# Patient Record
Sex: Female | Born: 1960 | Race: White | Hispanic: No | Smoking: Current every day smoker
Health system: Southern US, Community
[De-identification: ages and names within clinical notes are randomized; demographics above are authoritative.]

## PROBLEM LIST (undated history)

## (undated) DIAGNOSIS — IMO0002 Reserved for concepts with insufficient information to code with codable children: Secondary | ICD-10-CM

## (undated) DIAGNOSIS — B977 Papillomavirus as the cause of diseases classified elsewhere: Secondary | ICD-10-CM

## (undated) DIAGNOSIS — F32A Depression, unspecified: Secondary | ICD-10-CM

## (undated) DIAGNOSIS — Q400 Congenital hypertrophic pyloric stenosis: Secondary | ICD-10-CM

## (undated) HISTORY — DX: Reserved for concepts with insufficient information to code with codable children: IMO0002

## (undated) HISTORY — PX: TONSILLECTOMY AND ADENOIDECTOMY: SUR1326

## (undated) HISTORY — PX: TUBAL LIGATION: SHX77

## (undated) HISTORY — DX: Papillomavirus as the cause of diseases classified elsewhere: B97.7

---

## 2010-02-28 DIAGNOSIS — IMO0002 Reserved for concepts with insufficient information to code with codable children: Secondary | ICD-10-CM

## 2010-02-28 HISTORY — PX: COLPOSCOPY: SHX161

## 2010-02-28 HISTORY — DX: Reserved for concepts with insufficient information to code with codable children: IMO0002

## 2010-03-15 ENCOUNTER — Encounter

## 2010-03-15 ENCOUNTER — Other Ambulatory Visit: Payer: Self-pay | Admitting: Gynecology

## 2010-03-15 ENCOUNTER — Ambulatory Visit (INDEPENDENT_AMBULATORY_CARE_PROVIDER_SITE_OTHER): Admitting: Gynecology

## 2010-03-15 DIAGNOSIS — N871 Moderate cervical dysplasia: Secondary | ICD-10-CM

## 2010-03-15 DIAGNOSIS — N870 Mild cervical dysplasia: Secondary | ICD-10-CM

## 2010-09-19 ENCOUNTER — Ambulatory Visit (INDEPENDENT_AMBULATORY_CARE_PROVIDER_SITE_OTHER): Admitting: Gynecology

## 2010-09-19 ENCOUNTER — Other Ambulatory Visit (HOSPITAL_COMMUNITY)
Admission: RE | Admit: 2010-09-19 | Discharge: 2010-09-19 | Disposition: A | Discharge status: Home / Self Care | Source: Ambulatory Visit | Attending: Gynecology | Admitting: Gynecology

## 2010-09-19 ENCOUNTER — Encounter: Payer: Self-pay | Admitting: Gynecology

## 2010-09-19 DIAGNOSIS — N870 Mild cervical dysplasia: Secondary | ICD-10-CM

## 2010-09-19 DIAGNOSIS — N879 Dysplasia of cervix uteri, unspecified: Secondary | ICD-10-CM

## 2010-09-19 DIAGNOSIS — Z01419 Encounter for gynecological examination (general) (routine) without abnormal findings: Secondary | ICD-10-CM | POA: Insufficient documentation

## 2010-09-19 NOTE — Progress Notes (Signed)
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 She does give a history of abnormal Pap when she was in her early 10s and although she said she had no treatment at last visit she now states that she had a cone biopsy and that everything was fine afterwards.  Exam Pelvic: External BUS vagina normal cervix grossly normal. Pap smear done followup ECC performed.  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 Pap and ECC done today. I reviewed with her the various issues particularly with a positive ECC and whether we should proceed with a LEEP or Cone biopsy. The issues of positive high risk HPV were reviewed and she understands that even if we would proceed with Cone biopsy or hysterectomy this would not eliminate the virus and the potential for persistent cervical disease andvaginal intraepithelial neoplasia and vulvar intraepithelial neoplasia is present.  Patient knows importance of followup and that we will call her with the biopsy results in further discuss the plan.

## 2010-09-22 ENCOUNTER — Telehealth: Payer: Self-pay | Admitting: Gynecology

## 2010-09-22 NOTE — Telephone Encounter (Signed)
Tell pt that Pap and ECC were normal.  Recommend re-pap in 6 months.  Very important to follow up.

## 2010-09-24 NOTE — Telephone Encounter (Signed)
PT INFORMED WITH THE BELOW NOTE AND RECALL LETTER IN COMPUTER.

## 2011-06-20 ENCOUNTER — Ambulatory Visit (INDEPENDENT_AMBULATORY_CARE_PROVIDER_SITE_OTHER): Admitting: Family Medicine

## 2011-06-20 VITALS — BP 148/74 | HR 71 | Temp 98.3°F | Resp 16 | Ht 66.25 in | Wt 129.8 lb

## 2011-06-20 DIAGNOSIS — M545 Low back pain: Secondary | ICD-10-CM

## 2011-06-20 LAB — POCT UA - MICROSCOPIC ONLY
Casts, Ur, LPF, POC: NEGATIVE
Yeast, UA: NEGATIVE

## 2011-06-20 LAB — POCT URINALYSIS DIPSTICK
Bilirubin, UA: NEGATIVE
Blood, UA: NEGATIVE
Leukocytes, UA: NEGATIVE
Nitrite, UA: NEGATIVE
Urobilinogen, UA: 1
pH, UA: 7

## 2011-06-20 NOTE — Progress Notes (Signed)
  Subjective:    Patient ID: Crystal Richards, female    DOB: Mar 07, 1960, 51 y.o.   MRN: [id number]  HPI 51 yo female here with back pain.  Left sided low back pain for 2-3 days.  STarting to radiate to right.  Stomach crampy.  Hurts all the time.  NO radiation down legs.  No fever or nausea.  No dysuria or frequency or urgency.  Some waxing and waning.     Review of Systems Negative except as per HPI     Objective:   Physical Exam    Results for orders placed in visit on 06/20/11  POCT UA - MICROSCOPIC ONLY      Component Value Range   WBC, Ur, HPF, POC 1-3     RBC, urine, microscopic 0-2     Bacteria, U Microscopic trace     Mucus, UA neg     Epithelial cells, urine per micros 2-4     Crystals, Ur, HPF, POC neg     Casts, Ur, LPF, POC neg     Yeast, UA neg    POCT URINALYSIS DIPSTICK      Component Value Range   Color, UA amber     Clarity, UA hazy     Glucose, UA neg     Bilirubin, UA neg     Ketones, UA 15     Spec Grav, UA 1.025     Blood, UA neg     pH, UA 7.0     Protein, UA trace     Urobilinogen, UA 1.0     Nitrite, UA neg     Leukocytes, UA Negative         Assessment & Plan:  Low back pain - will culture urine, though no urinary symptoms.  Suspect MSK, eventhough no remembered agggravating event.  Declines flexeril while waiting.  Will continue ibuprofen, heat, ice.  If worsens or develops urine symptoms, call and we can call out an antibiotic.

## 2014-12-14 ENCOUNTER — Emergency Department (HOSPITAL_COMMUNITY)

## 2014-12-14 ENCOUNTER — Emergency Department (HOSPITAL_COMMUNITY)
Admission: EM | Admit: 2014-12-14 | Discharge: 2014-12-14 | Disposition: A | Discharge status: Home / Self Care | Attending: Emergency Medicine | Admitting: Emergency Medicine

## 2014-12-14 ENCOUNTER — Encounter (HOSPITAL_COMMUNITY): Payer: Self-pay | Admitting: Emergency Medicine

## 2014-12-14 DIAGNOSIS — R350 Frequency of micturition: Secondary | ICD-10-CM | POA: Diagnosis present

## 2014-12-14 DIAGNOSIS — Z880 Allergy status to penicillin: Secondary | ICD-10-CM | POA: Insufficient documentation

## 2014-12-14 DIAGNOSIS — R630 Anorexia: Secondary | ICD-10-CM | POA: Insufficient documentation

## 2014-12-14 DIAGNOSIS — K5900 Constipation, unspecified: Secondary | ICD-10-CM | POA: Diagnosis not present

## 2014-12-14 DIAGNOSIS — F17200 Nicotine dependence, unspecified, uncomplicated: Secondary | ICD-10-CM | POA: Insufficient documentation

## 2014-12-14 DIAGNOSIS — N390 Urinary tract infection, site not specified: Secondary | ICD-10-CM | POA: Insufficient documentation

## 2014-12-14 DIAGNOSIS — Z8619 Personal history of other infectious and parasitic diseases: Secondary | ICD-10-CM | POA: Diagnosis not present

## 2014-12-14 LAB — URINALYSIS, ROUTINE W REFLEX MICROSCOPIC
Bilirubin Urine: NEGATIVE
GLUCOSE, UA: NEGATIVE mg/dL
Nitrite: POSITIVE — AB
PH: 5.5 (ref 5.0–8.0)
SPECIFIC GRAVITY, URINE: 1.025 (ref 1.005–1.030)

## 2014-12-14 LAB — URINE MICROSCOPIC-ADD ON

## 2014-12-14 LAB — CBC WITH DIFFERENTIAL/PLATELET
Basophils Absolute: 0 10*3/uL (ref 0.0–0.1)
Basophils Relative: 0 %
EOS PCT: 0 %
Eosinophils Absolute: 0 10*3/uL (ref 0.0–0.7)
HCT: 43.6 % (ref 36.0–46.0)
Hemoglobin: 15.1 g/dL — ABNORMAL HIGH (ref 12.0–15.0)
LYMPHS ABS: 0.9 10*3/uL (ref 0.7–4.0)
LYMPHS PCT: 8 %
MCH: 34.4 pg — AB (ref 26.0–34.0)
MCHC: 34.6 g/dL (ref 30.0–36.0)
MCV: 99.3 fL (ref 78.0–100.0)
MONO ABS: 0.5 10*3/uL (ref 0.1–1.0)
Monocytes Relative: 4 %
Neutro Abs: 10.5 10*3/uL — ABNORMAL HIGH (ref 1.7–7.7)
Neutrophils Relative %: 88 %
PLATELETS: 279 10*3/uL (ref 150–400)
RBC: 4.39 MIL/uL (ref 3.87–5.11)
RDW: 12.3 % (ref 11.5–15.5)
WBC: 11.9 10*3/uL — ABNORMAL HIGH (ref 4.0–10.5)

## 2014-12-14 LAB — COMPREHENSIVE METABOLIC PANEL
ALT: 21 U/L (ref 14–54)
AST: 24 U/L (ref 15–41)
Albumin: 4.5 g/dL (ref 3.5–5.0)
Alkaline Phosphatase: 56 U/L (ref 38–126)
Anion gap: 9 (ref 5–15)
BUN: 20 mg/dL (ref 6–20)
CALCIUM: 9.4 mg/dL (ref 8.9–10.3)
CHLORIDE: 105 mmol/L (ref 101–111)
CO2: 24 mmol/L (ref 22–32)
CREATININE: 0.47 mg/dL (ref 0.44–1.00)
Glucose, Bld: 138 mg/dL — ABNORMAL HIGH (ref 65–99)
Potassium: 4 mmol/L (ref 3.5–5.1)
Sodium: 138 mmol/L (ref 135–145)
TOTAL PROTEIN: 7.6 g/dL (ref 6.5–8.1)
Total Bilirubin: 0.7 mg/dL (ref 0.3–1.2)

## 2014-12-14 LAB — LIPASE, BLOOD: LIPASE: 34 U/L (ref 11–51)

## 2014-12-14 MED ORDER — PHENAZOPYRIDINE HCL 100 MG PO TABS
200.0000 mg | ORAL_TABLET | Freq: Once | ORAL | Status: AC
  Administered 2014-12-14: 200 mg via ORAL
  Filled 2014-12-14: qty 2

## 2014-12-14 MED ORDER — HYDROCODONE-ACETAMINOPHEN 5-325 MG PO TABS: 1.0000 | ORAL_TABLET | ORAL | Status: DC | PRN

## 2014-12-14 MED ORDER — MORPHINE SULFATE (PF) 4 MG/ML IV SOLN
4.0000 mg | Freq: Once | INTRAVENOUS | Status: AC
Start: 2014-12-14 — End: 2014-12-14
  Administered 2014-12-14: 4 mg via INTRAVENOUS
  Filled 2014-12-14: qty 1

## 2014-12-14 MED ORDER — SULFAMETHOXAZOLE-TRIMETHOPRIM 800-160 MG PO TABS: 1.0000 | ORAL_TABLET | Freq: Two times a day (BID) | ORAL | Status: AC

## 2014-12-14 MED ORDER — PHENAZOPYRIDINE HCL 200 MG PO TABS: 200.0000 mg | ORAL_TABLET | Freq: Three times a day (TID) | ORAL | Status: DC

## 2014-12-14 MED ORDER — SULFAMETHOXAZOLE-TRIMETHOPRIM 800-160 MG PO TABS
1.0000 | ORAL_TABLET | Freq: Once | ORAL | Status: AC
  Administered 2014-12-14: 1 via ORAL
  Filled 2014-12-14: qty 1

## 2014-12-14 MED ORDER — ONDANSETRON HCL 4 MG/2ML IJ SOLN
4.0000 mg | Freq: Once | INTRAMUSCULAR | Status: AC
  Administered 2014-12-14: 4 mg via INTRAVENOUS
  Filled 2014-12-14: qty 2

## 2014-12-14 NOTE — ED Notes (Signed)
After triage, pt says she is having burning when voiding.

## 2014-12-14 NOTE — ED Provider Notes (Signed)
CSN: [id number]     Arrival date & time 12/14/14  1348 History   First MD Initiated Contact with Patient 12/14/14 1357     Chief Complaint  Patient presents with  . Abdominal Pain  . Urinary Tract Infection     (Consider location/radiation/quality/duration/timing/severity/associated sxs/prior Treatment) The history is provided by the patient.   Crystal Richards is a 54 y.o. female presenting with a one week history of urinary frequency along with burning pain with urination suggesting a uti which she reports having occasionally.  She denies fevers, chills, nausea or vomiting, no hematuria, back or flank pain. However, last night she woke with aching and stabbing mid abdominal pain, at first she thought she ate something "bad" but denies nausea, vomiting, diarrhea.  States last bm was 2 days ago and normal, but unusual since she usually has bm's daily.  She had anorexia today stating she was afraid to eat and make it worse.  Pain is worsened with movement but can intensify at rest as well. She has had no medicine or found any alleviators prior to arrival.     Past Medical History  Diagnosis Date  . 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 High risk HPV infection    Past Surgical History  Procedure Laterality Date  . Tonsillectomy and adenoidectomy    . Tubal ligation    . Cesarean section    . Colposcopy  02/2010    + ECC LGSIL fragment   Family History  Problem Relation Age of Onset  . Diabetes Mother   . Hypertension Mother    Social History  Substance Use Topics  . Smoking status: Current Every Day Smoker -- 0.80 packs/day  . Smokeless tobacco: Never Used  . Alcohol Use: Yes     Comment: occassionally   OB History    Gravida Para Term Preterm AB TAB SAB Ectopic Multiple Living   3 3             Review of Systems  Constitutional: Negative for fever.  HENT: Negative for congestion and sore throat.   Eyes: Negative.   Respiratory: Negative for  chest tightness and shortness of breath.   Cardiovascular: Negative for chest pain.  Gastrointestinal: Positive for abdominal pain and constipation. Negative for nausea, vomiting and diarrhea.  Genitourinary: Positive for dysuria and frequency. Negative for urgency, hematuria and pelvic pain.  Musculoskeletal: Negative for joint swelling, arthralgias and neck pain.  Skin: Negative.  Negative for rash and wound.  Neurological: Negative for dizziness, weakness, light-headedness, numbness and headaches.  Psychiatric/Behavioral: Negative.       Allergies  Amoxicillin  Home Medications   Prior to Admission medications   Medication Sig Start Date End Date Taking? Authorizing Provider  HYDROcodone-acetaminophen (NORCO/VICODIN) 5-325 MG tablet Take 1 tablet by mouth every 4 (four) hours as needed. 12/14/14   Burgess Amor, PA-C  phenazopyridine (PYRIDIUM) 200 MG tablet Take 1 tablet (200 mg total) by mouth 3 (three) times daily. 12/14/14   Burgess Amor, PA-C  sulfamethoxazole-trimethoprim (BACTRIM DS,SEPTRA DS) 800-160 MG tablet Take 1 tablet by mouth 2 (two) times daily. 12/14/14 12/21/14  Burgess Amor, PA-C   BP 124/78 mmHg  Pulse 63  Temp(Src) 98.4 F (36.9 C) (Oral)  Resp 18  Ht  (1.676 m)  Wt 118 lb (53.524 kg)  BMI 19.05 kg/m2  SpO2 96%  LMP 08/28/2010 Physical Exam  Constitutional: She appears well-developed and well-nourished.  HENT:  Head: Normocephalic and atraumatic.  Eyes: Conjunctivae are  normal.  Neck: Normal range of motion.  Cardiovascular: Normal rate, regular rhythm, normal heart sounds and intact distal pulses.   Pulmonary/Chest: Effort normal and breath sounds normal. No respiratory distress.  Abdominal: Soft. Bowel sounds are normal. She exhibits no mass. There is no hepatosplenomegaly. There is tenderness in the right upper quadrant and periumbilical area. There is positive Murphy's sign. There is no rebound, no guarding and no CVA tenderness.  Musculoskeletal:  Normal range of motion.  Neurological: She is alert.  Skin: Skin is warm and dry.  Psychiatric: She has a normal mood and affect.  Nursing note and vitals reviewed.   ED Course  Procedures (including critical care time) Labs Review Labs Reviewed  URINALYSIS, ROUTINE W REFLEX MICROSCOPIC (NOT AT [facility]) - Abnormal; Notable for the following:    Hgb urine dipstick TRACE (*)    Ketones, ur TRACE (*)    Protein, ur TRACE (*)    Nitrite POSITIVE (*)    Leukocytes, UA SMALL (*)    All other components within normal limits  CBC WITH DIFFERENTIAL/PLATELET - Abnormal; Notable for the following:    WBC 11.9 (*)    Hemoglobin 15.1 (*)    MCH 34.4 (*)    Neutro Abs 10.5 (*)    All other components within normal limits  COMPREHENSIVE METABOLIC PANEL - Abnormal; Notable for the following:    Glucose, Bld 138 (*)    All other components within normal limits  URINE MICROSCOPIC-ADD ON - Abnormal; Notable for the following:    Squamous Epithelial / LPF 0-5 (*)    Bacteria, UA MANY (*)    All other components within normal limits  URINE CULTURE  LIPASE, BLOOD    Imaging Review [place] Abdomen Limited  12/14/2014  CLINICAL DATA:  Right upper quadrant pain EXAM: US ABDOMEN LIMITED - RIGHT UPPER QUADRANT COMPARISON:  None. FINDINGS: Gallbladder: No gallstones or wall thickening visualized. No sonographic Murphy sign noted. Common bile duct: Diameter: 2.1 mm Liver: No focal lesion identified. Within normal limits in parenchymal echogenicity. IMPRESSION: Normal exam. Electronically Signed   By: Signa Kellaylor  Stroud M.D.   On: 12/14/2014 16:59   I have personally reviewed and evaluated these images and lab results as part of my medical decision-making.   EKG Interpretation None      MDM   Final diagnoses:  UTI (lower urinary tract infection)    Patients labs reviewed.    Results were also discussed with patient. Pt with uti, but pain in upper abdomen/ruq, not c/w uti.  No cva ttp, doubt  pyelonephritis.  Urine culture was ordered.  She was started on bactrim, first dose given here.   US results reviewed and negative.  Pyridium also prescribed, encouraged increased fluid intake, recheck if sx persist or worsen.  Referral given for pcp, advised return here for any worsened or changing sx, fevers, worse pain, vomiting, back or flank pain, weakness.     Burgess AmorJulie Denym Christenberry, PA-C 12/15/14 [id number]  Azalia BilisKevin Campos, MD 12/15/14 [phone]

## 2014-12-14 NOTE — ED Notes (Signed)
PA Julie Idol at bedside. 

## 2014-12-14 NOTE — ED Notes (Signed)
PA Julie Idol at bedside updating patient. 

## 2014-12-14 NOTE — ED Notes (Signed)
Pt made aware a urine specimen is needed. Pt verbalized understanding and will notify staff when one can be obtained.  

## 2014-12-14 NOTE — ED Notes (Signed)
Started with Mid abdominal pain, rates pain 8/10.  Pain is aching and stabbing.

## 2014-12-14 NOTE — Discharge Instructions (Signed)

## 2014-12-15 LAB — URINE CULTURE

## 2014-12-19 ENCOUNTER — Emergency Department (HOSPITAL_COMMUNITY)
Admission: EM | Admit: 2014-12-19 | Discharge: 2014-12-20 | Disposition: A | Discharge status: Home / Self Care | Attending: Emergency Medicine | Admitting: Emergency Medicine

## 2014-12-19 ENCOUNTER — Encounter (HOSPITAL_COMMUNITY): Payer: Self-pay

## 2014-12-19 DIAGNOSIS — Z880 Allergy status to penicillin: Secondary | ICD-10-CM | POA: Diagnosis not present

## 2014-12-19 DIAGNOSIS — F17200 Nicotine dependence, unspecified, uncomplicated: Secondary | ICD-10-CM | POA: Insufficient documentation

## 2014-12-19 DIAGNOSIS — Z79899 Other long term (current) drug therapy: Secondary | ICD-10-CM | POA: Diagnosis not present

## 2014-12-19 DIAGNOSIS — R1032 Left lower quadrant pain: Secondary | ICD-10-CM | POA: Diagnosis not present

## 2014-12-19 DIAGNOSIS — Z8619 Personal history of other infectious and parasitic diseases: Secondary | ICD-10-CM | POA: Diagnosis not present

## 2014-12-19 DIAGNOSIS — R109 Unspecified abdominal pain: Secondary | ICD-10-CM | POA: Diagnosis present

## 2014-12-19 DIAGNOSIS — R300 Dysuria: Secondary | ICD-10-CM | POA: Diagnosis not present

## 2014-12-19 DIAGNOSIS — R509 Fever, unspecified: Secondary | ICD-10-CM | POA: Insufficient documentation

## 2014-12-19 DIAGNOSIS — R110 Nausea: Secondary | ICD-10-CM | POA: Diagnosis not present

## 2014-12-19 LAB — URINALYSIS, ROUTINE W REFLEX MICROSCOPIC
Glucose, UA: 100 mg/dL — AB
Nitrite: NEGATIVE
Protein, ur: 30 mg/dL — AB
Specific Gravity, Urine: 1.025 (ref 1.005–1.030)
pH: 6 (ref 5.0–8.0)

## 2014-12-19 LAB — URINE MICROSCOPIC-ADD ON

## 2014-12-19 MED ORDER — CIPROFLOXACIN HCL 500 MG PO TABS: 500.0000 mg | ORAL_TABLET | Freq: Two times a day (BID) | ORAL | Status: DC

## 2014-12-19 MED ORDER — KETOROLAC TROMETHAMINE 30 MG/ML IJ SOLN
15.0000 mg | Freq: Once | INTRAMUSCULAR | Status: AC
  Administered 2014-12-19: 15 mg via INTRAVENOUS
  Filled 2014-12-19: qty 1

## 2014-12-19 MED ORDER — SODIUM CHLORIDE 0.9 % IV BOLUS (SEPSIS)
1000.0000 mL | Freq: Once | INTRAVENOUS | Status: AC
  Administered 2014-12-19: 1000 mL via INTRAVENOUS

## 2014-12-19 MED ORDER — CIPROFLOXACIN IN D5W 400 MG/200ML IV SOLN
400.0000 mg | Freq: Once | INTRAVENOUS | Status: AC
  Administered 2014-12-19: 400 mg via INTRAVENOUS
  Filled 2014-12-19: qty 200

## 2014-12-19 NOTE — ED Provider Notes (Signed)
CSN: [id number]     Arrival date & time 12/19/14  2118 History  By signing my name below, I, Crystal Richards, attest that this documentation has been prepared under the direction and in the presence of Crystal RazorStephen Bradleigh Sonnen, MD. Electronically Signed: Budd PalmerVanessa Richards, ED Scribe. 12/19/2014. 10:19 PM.    Chief Complaint  Patient presents with  . Flank Pain   The history is provided by the patient. No language interpreter was used.   HPI Comments: Crystal ScoreMarie Lobo is a 54 y.o. female who presents to the Emergency Department complaining of constant, aching, left flank pain onset earlier today. She notes she was seen in the ED 3 days ago and diagnosed with a UTI. She states she has been taking the prescribed bactrim with no improvement. She reports associated abdominal pain, dysuria, subjective fever, and nausea. Pt denies SOB and vomiting.  Pt is allergic to amoxicillin.   Past Medical History  Diagnosis Date  . 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 High risk HPV infection    Past Surgical History  Procedure Laterality Date  . Tonsillectomy and adenoidectomy    . Tubal ligation    . Cesarean section    . Colposcopy  02/2010    + ECC LGSIL fragment   Family History  Problem Relation Age of Onset  . Diabetes Mother   . Hypertension Mother    Social History  Substance Use Topics  . Smoking status: Current Every Day Smoker -- 0.80 packs/day  . Smokeless tobacco: Never Used  . Alcohol Use: Yes     Comment: occassionally   OB History    Gravida Para Term Preterm AB TAB SAB Ectopic Multiple Living   3 3             Review of Systems  Constitutional: Positive for fever.  Respiratory: Negative for shortness of breath.   Gastrointestinal: Positive for nausea and abdominal pain. Negative for vomiting.  Genitourinary: Positive for dysuria and flank pain.  All other systems reviewed and are negative.   Allergies  Amoxicillin  Home Medications   Prior to Admission  medications   Medication Sig Start Date End Date Taking? Authorizing Provider  HYDROcodone-acetaminophen (NORCO/VICODIN) 5-325 MG tablet Take 1 tablet by mouth every 4 (four) hours as needed. 12/14/14   Burgess AmorJulie Idol, PA-C  phenazopyridine (PYRIDIUM) 200 MG tablet Take 1 tablet (200 mg total) by mouth 3 (three) times daily. 12/14/14   Burgess AmorJulie Idol, PA-C  sulfamethoxazole-trimethoprim (BACTRIM DS,SEPTRA DS) 800-160 MG tablet Take 1 tablet by mouth 2 (two) times daily. 12/14/14 12/21/14  Burgess AmorJulie Idol, PA-C   BP 108/76 mmHg  Pulse 121  Temp(Src) 99.4 F (37.4 C) (Oral)  Resp 16  Ht 5\' 6"  (1.676 m)  Wt 118 lb (53.524 kg)  BMI 19.05 kg/m2  SpO2 100%  LMP 08/28/2010 Physical Exam  Constitutional: She appears well-developed and well-nourished.  HENT:  Head: Normocephalic and atraumatic.  Eyes: Conjunctivae are normal. Right eye exhibits no discharge. Left eye exhibits no discharge.  Pulmonary/Chest: Effort normal. No respiratory distress.  Abdominal: There is tenderness.  Mild suprapubic, LLQ, and left lower flank TTP. No CVA TTP.  Neurological: She is alert. Coordination normal.  Skin: Skin is warm and dry. No rash noted. She is not diaphoretic. No erythema.  Psychiatric: She has a normal mood and affect.  Nursing note and vitals reviewed.   ED Course  Procedures  DIAGNOSTIC STUDIES: Oxygen Saturation is 100% on RA, normal by my interpretation.  COORDINATION OF CARE: 10:17 PM - Discussed possible pyelonephritis. Discussed culture results from previous visit. Discussed plans to order a different antibiotic as well as another round of diagnostic studies. Will write a note for work. Pt advised of plan for treatment and pt agrees.  Labs Review Labs Reviewed  URINALYSIS, ROUTINE W REFLEX MICROSCOPIC (NOT AT [facility]) - Abnormal; Notable for the following:    Color, Urine AMBER (*)    APPearance HAZY (*)    Glucose, UA 100 (*)    Hgb urine dipstick LARGE (*)    Bilirubin Urine MODERATE (*)     Ketones, ur TRACE (*)    Protein, ur 30 (*)    Leukocytes, UA TRACE (*)    All other components within normal limits  URINE MICROSCOPIC-ADD ON - Abnormal; Notable for the following:    Squamous Epithelial / LPF 6-30 (*)    Bacteria, UA MANY (*)    All other components within normal limits  URINE CULTURE    Imaging Review No results found. I have personally reviewed and evaluated these images and lab results as part of my medical decision-making.   EKG Interpretation None      MDM   Final diagnoses:  Flank pain   54yF with flank pain. Recent diagnosis of uti on bactrim. Taking w/o improvement. No culture data. Urine remains suggestive. Will switch for possible resistant organism. Culture sent. Doesn't seem like ureteral colic. It has been determined that no acute conditions requiring further emergency intervention are present at this time. The patient has been advised of the diagnosis and plan. I reviewed any labs and imaging including any potential incidental findings. We have discussed signs and symptoms that warrant return to the ED and they are listed in the discharge instructions.    I personally preformed the services scribed in my presence. The recorded information has been reviewed is accurate. Crystal Razor, MD.   Crystal Razor, MD 01/03/15 1340

## 2014-12-19 NOTE — ED Notes (Signed)
Patient was seen here last week and diagnosed with UTI. Patient states she has been taking her ABX with no relief, and no has left flank pain with nausea.

## 2014-12-21 ENCOUNTER — Encounter (HOSPITAL_COMMUNITY): Payer: Self-pay | Admitting: Emergency Medicine

## 2014-12-21 ENCOUNTER — Emergency Department (HOSPITAL_COMMUNITY)
Admission: EM | Admit: 2014-12-21 | Discharge: 2014-12-21 | Disposition: A | Discharge status: Home / Self Care | Attending: Emergency Medicine | Admitting: Emergency Medicine

## 2014-12-21 DIAGNOSIS — L270 Generalized skin eruption due to drugs and medicaments taken internally: Secondary | ICD-10-CM | POA: Diagnosis not present

## 2014-12-21 DIAGNOSIS — F17200 Nicotine dependence, unspecified, uncomplicated: Secondary | ICD-10-CM | POA: Diagnosis not present

## 2014-12-21 DIAGNOSIS — Z880 Allergy status to penicillin: Secondary | ICD-10-CM | POA: Insufficient documentation

## 2014-12-21 DIAGNOSIS — T368X5A Adverse effect of other systemic antibiotics, initial encounter: Secondary | ICD-10-CM | POA: Diagnosis not present

## 2014-12-21 DIAGNOSIS — T50905A Adverse effect of unspecified drugs, medicaments and biological substances, initial encounter: Secondary | ICD-10-CM

## 2014-12-21 DIAGNOSIS — Z8619 Personal history of other infectious and parasitic diseases: Secondary | ICD-10-CM | POA: Diagnosis not present

## 2014-12-21 DIAGNOSIS — Z87440 Personal history of urinary (tract) infections: Secondary | ICD-10-CM | POA: Diagnosis not present

## 2014-12-21 LAB — URINE CULTURE: Culture: NO GROWTH

## 2014-12-21 LAB — I-STAT CHEM 8, ED
BUN: 10 mg/dL (ref 6–20)
Calcium, Ion: 1.17 mmol/L (ref 1.12–1.23)
Chloride: 94 mmol/L — ABNORMAL LOW (ref 101–111)
Creatinine, Ser: 0.6 mg/dL (ref 0.44–1.00)
Glucose, Bld: 107 mg/dL — ABNORMAL HIGH (ref 65–99)
HCT: 36 % (ref 36.0–46.0)
Hemoglobin: 12.2 g/dL (ref 12.0–15.0)
Potassium: 3.9 mmol/L (ref 3.5–5.1)
Sodium: 133 mmol/L — ABNORMAL LOW (ref 135–145)
TCO2: 23 mmol/L (ref 0–100)

## 2014-12-21 LAB — URINALYSIS, ROUTINE W REFLEX MICROSCOPIC
Glucose, UA: NEGATIVE mg/dL
HGB URINE DIPSTICK: NEGATIVE
KETONES UR: 15 mg/dL — AB
Leukocytes, UA: NEGATIVE
NITRITE: NEGATIVE
PH: 5.5 (ref 5.0–8.0)
Protein, ur: NEGATIVE mg/dL
Specific Gravity, Urine: 1.025 (ref 1.005–1.030)

## 2014-12-21 MED ORDER — DIPHENHYDRAMINE HCL 25 MG PO CAPS
25.0000 mg | ORAL_CAPSULE | Freq: Once | ORAL | Status: AC
  Administered 2014-12-21: 25 mg via ORAL
  Filled 2014-12-21: qty 1

## 2014-12-21 MED ORDER — CEPHALEXIN 500 MG PO CAPS: 500.0000 mg | ORAL_CAPSULE | Freq: Four times a day (QID) | ORAL | Status: DC

## 2014-12-21 MED ORDER — CEPHALEXIN 500 MG PO CAPS
500.0000 mg | ORAL_CAPSULE | Freq: Once | ORAL | Status: AC
  Administered 2014-12-21: 500 mg via ORAL
  Filled 2014-12-21: qty 1

## 2014-12-21 NOTE — ED Notes (Signed)
Patient given Ginger Ale at this time. 

## 2014-12-21 NOTE — ED Notes (Signed)
Patient states she started Cipro for UTI on Monday. Complaining of generalized rash starting yesterday and swelling to joints this morning. Patient has hives noted to abdomen, torso, and bilateral arms

## 2014-12-21 NOTE — ED Notes (Signed)
Pt states understanding of care given and follow up instructions 

## 2014-12-21 NOTE — Discharge Instructions (Signed)
Drug Allergy °Allergic reactions to medicines are common. Some allergic reactions are mild. A delayed type of drug allergy that occurs 1 week or more after exposure to a medicine or vaccine is called serum sickness. A life-threatening, sudden (acute) allergic reaction that involves the whole body is called anaphylaxis. °CAUSES  °"True" drug allergies occur when there is an allergic reaction to a medicine. This is caused by overactivity of the immune system. First, the body becomes sensitized. The immune system is triggered by your first exposure to the medicine. Following this first exposure, future exposure to the same medicine may be life-threatening. °Almost any medicine can cause an allergic reaction. Common ones are: °· Penicillin. °· Sulfonamides (sulfa drugs). °· Local anesthetics. °· X-ray dyes that contain iodine. °SYMPTOMS  °Common symptoms of a minor allergic reaction are: °· Swelling around the mouth. °· An itchy red rash or hives. °· Vomiting or diarrhea. °Anaphylaxis can cause swelling of the mouth and throat. This makes it difficult to breathe and swallow. Severe reactions can be fatal within seconds, even after exposure to only a trace amount of the drug that causes the reaction. °HOME CARE INSTRUCTIONS °· If you are unsure of what caused your reaction, write down: °¨ The names of the medicines you took. °¨ How much medicine you took. °¨ How you took the medicine, such as whether you took a pill, injected the medicine, or applied it to your skin. °¨ All of the things you ate and drank. °¨ The date and time of your reaction. °¨ The symptoms of the reaction. °· You may want to follow up with an allergy specialist after the reaction has cleared in order to be tested to confirm the allergy. It is important to confirm that your reaction is an allergy, not just a side effect to the medicine. If you have a true allergy to a medicine, this may prevent that medicine and related medicines from being given to  you when you are very ill. °· If you have hives or a rash: °¨ Take medicines as directed by your caregiver. °¨ You may use an over-the-counter antihistamine (diphenhydramine) as needed. °¨ Apply cold compresses to the skin or take baths in cool water. Avoid hot baths or showers. °· If you are severely allergic: °¨ Continuous observation after a severe reaction may be needed. Hospitalization is often required. °¨ Wear a medical alert bracelet or necklace stating your allergy. °¨ You and your family must learn how to use an anaphylaxis kit or give an epinephrine injection to temporarily treat an emergency allergic reaction. If you have had a severe reaction, always carry your epinephrine injection or anaphylaxis kit with you. This can be lifesaving if you have a severe reaction. °· Do not drive or perform tasks after treatment until the medicines used to treat your reaction have worn off, or until your caregiver says it is okay. °· If you have a drug allergy that was confirmed by your health care provider: °¨ Carry information about the drug allergy with you at all times. °¨ Always check with a pharmacist before taking any over-the-counter medicine. °SEEK MEDICAL CARE IF:  °· You think you had an allergic reaction. Symptoms usually start within 30 minutes after exposure. °· Symptoms are getting worse rather than better. °· You develop new symptoms. °· The symptoms that brought you to your caregiver return. °SEEK IMMEDIATE MEDICAL CARE IF:  °· You have swelling of the mouth, difficulty breathing, or wheezing. °· You have a tight   feeling in your chest or throat.  You develop hives, swelling, or itching all over your body.  You develop severe vomiting or diarrhea.  You feel faint or pass out. This is an emergency. Use your epinephrine injection or anaphylaxis kit as you have been instructed. Call for emergency medical help. Even if you improve after the injection, you need to be examined at a hospital emergency  department. MAKE SURE YOU:   Understand these instructions.  Will watch your condition.  Will get help right away if you are not doing well or get worse.   This information is not intended to replace advice given to you by your health care provider. Make sure you discuss any questions you have with your health care provider.   Document Released: 01/14/2005 Document Revised: 02/04/2014 Document Reviewed: 08/16/2014 Elsevier Interactive Patient Education [employer].

## 2014-12-22 NOTE — ED Provider Notes (Signed)
CSN: [id number]     Arrival date & time 12/21/14  2000 History   First MD Initiated Contact with Patient 12/21/14 2014     Chief Complaint  Patient presents with  . Rash  . Joint Swelling     (Consider location/radiation/quality/duration/timing/severity/associated sxs/prior Treatment) The history is provided by the patient.   Crystal Richards is a 54 y.o. female presenting with a mildly itchy rash on her abdomen, chest and arms since yesterday along with waking this am with her knees and hands feeling tight and swollen, although this symptom is improved at present.  She was seen here 5 days ago and placed on Bactrim for UTI, then returned 2 days ago with persistent symptoms, repeat of urinalysis revealing ongoing infection and was switched to Cipro, which she suspects is the source of her rash today.  She denies urinary symptoms at this time, including no hematuria, dysuria, nausea vomiting or abdominal pain.  She also denies shortness of breath, mouth tongue or throat swelling or difficulty swallowing.    Past Medical History  Diagnosis Date  . 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 High risk HPV infection    Past Surgical History  Procedure Laterality Date  . Tonsillectomy and adenoidectomy    . Tubal ligation    . Cesarean section    . Colposcopy  02/2010    + ECC LGSIL fragment   Family History  Problem Relation Age of Onset  . Diabetes Mother   . Hypertension Mother    Social History  Substance Use Topics  . Smoking status: Current Every Day Smoker -- 0.80 packs/day  . Smokeless tobacco: Never Used  . Alcohol Use: Yes     Comment: occassionally   OB History    Gravida Para Term Preterm AB TAB SAB Ectopic Multiple Living   3 3             Review of Systems  Constitutional: Negative for fever and chills.  Respiratory: Negative for shortness of breath and wheezing.   Musculoskeletal: Positive for joint swelling.  Skin: Positive for rash.   Neurological: Negative for numbness.      Allergies  Amoxicillin and Ciprofloxacin  Home Medications   Prior to Admission medications   Medication Sig Start Date End Date Taking? Authorizing Provider  HYDROcodone-acetaminophen (NORCO/VICODIN) 5-325 MG tablet Take 1 tablet by mouth every 4 (four) hours as needed. Patient taking differently: Take 1 tablet by mouth every 4 (four) hours as needed for moderate pain or severe pain.  12/14/14  Yes Raynelle Fanning Kymani Laursen, PA-C  cephALEXin (KEFLEX) 500 MG capsule Take 1 capsule (500 mg total) by mouth 4 (four) times daily. 12/21/14   Burgess Amor, PA-C  phenazopyridine (PYRIDIUM) 200 MG tablet Take 1 tablet (200 mg total) by mouth 3 (three) times daily. Patient not taking: Reported on 12/21/2014 12/14/14   Burgess Amor, PA-C   BP 94/54 mmHg  Pulse 82  Temp(Src) 98.6 F (37 C) (Oral)  Resp 18  Ht 5' 6.5" (1.689 m)  Wt 53.524 kg  BMI 18.76 kg/m2  SpO2 98%  LMP 08/28/2010 Physical Exam  Constitutional: She appears well-developed and well-nourished. No distress.  HENT:  Head: Normocephalic.  Neck: Neck supple.  Cardiovascular: Normal rate.   Pulmonary/Chest: Effort normal. She has no wheezes.  Musculoskeletal: Normal range of motion. She exhibits no edema.  No knee or hand edema appreciated.  Skin: Rash noted.  Lacy appearing blanching macular erythema predominantly on abdomen, torso and upper arms.  There are no papules or pustules, no drainage.    ED Course  Procedures (including critical care time) Labs Review Labs Reviewed  URINALYSIS, ROUTINE W REFLEX MICROSCOPIC (NOT AT [facility]) - Abnormal; Notable for the fol[facility]ing:    Bilirubin Urine SMALL (*)    Ketones, ur 15 (*)    All other components within normal limits  I-STAT CHEM 8, ED - Abnormal; Notable for the following:    Sodium 133 (*)    Chloride 94 (*)    Glucose, Bld 107 (*)    All other components within normal limits  URINE CULTURE    Imaging Review No results found. I have  personally reviewed and evaluated these images and lab results as part of my medical decision-making.   EKG Interpretation None      MDM   Final diagnoses:  Medication reaction, initial encounter    Labs reviewed, urinalysis clean at this point.  Urine cultures from prior visits reviewed, but without useful results or sensitivities.  There was concern for possible pyelo-given left sided flank pain at her visit 2 days ago.  Even the urine results are negative today, patient will continue with antibiotics, advised to stop taking her Cipro, she was placed on Keflex with first dose given prior to discharge home.  She is also encouraged to take Benadryl for rash.  When necessary follow-up anticipated.    Burgess AmorJulie Namrata Dangler, PA-C 12/22/14 2315  Geoffery Lyonsouglas Delo, MD 12/22/14 [phone]

## 2014-12-23 LAB — URINE CULTURE: Culture: NO GROWTH

## 2016-03-14 ENCOUNTER — Emergency Department (HOSPITAL_COMMUNITY)
Admission: EM | Admit: 2016-03-14 | Discharge: 2016-03-14 | Disposition: A | Discharge status: Home / Self Care | Attending: Emergency Medicine | Admitting: Emergency Medicine

## 2016-03-14 ENCOUNTER — Encounter (HOSPITAL_COMMUNITY): Payer: Self-pay | Admitting: Emergency Medicine

## 2016-03-14 DIAGNOSIS — S0993XA Unspecified injury of face, initial encounter: Secondary | ICD-10-CM | POA: Insufficient documentation

## 2016-03-14 DIAGNOSIS — Y999 Unspecified external cause status: Secondary | ICD-10-CM | POA: Diagnosis not present

## 2016-03-14 DIAGNOSIS — Y939 Activity, unspecified: Secondary | ICD-10-CM | POA: Insufficient documentation

## 2016-03-14 DIAGNOSIS — F17200 Nicotine dependence, unspecified, uncomplicated: Secondary | ICD-10-CM | POA: Insufficient documentation

## 2016-03-14 DIAGNOSIS — Y929 Unspecified place or not applicable: Secondary | ICD-10-CM | POA: Insufficient documentation

## 2016-03-14 MED ORDER — CLINDAMYCIN HCL 150 MG PO CAPS: 150.0000 mg | ORAL_CAPSULE | Freq: Four times a day (QID) | ORAL | 0 refills | Status: DC

## 2016-03-14 MED ORDER — NAPROXEN 500 MG PO TABS: 500.0000 mg | ORAL_TABLET | Freq: Two times a day (BID) | ORAL | 0 refills | Status: DC

## 2016-03-14 NOTE — Discharge Instructions (Signed)
Ice packs on/off to your chin.  Follow-up with your dentist soon.  Soft foods and liquids for at least one week.
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
## 2016-06-26 ENCOUNTER — Encounter (HOSPITAL_COMMUNITY): Payer: Self-pay | Admitting: *Deleted

## 2016-06-26 ENCOUNTER — Emergency Department (HOSPITAL_COMMUNITY)
Admission: EM | Admit: 2016-06-26 | Discharge: 2016-06-27 | Disposition: A | Discharge status: Home / Self Care | Attending: Emergency Medicine | Admitting: Emergency Medicine

## 2016-06-26 ENCOUNTER — Emergency Department (HOSPITAL_COMMUNITY)

## 2016-06-26 DIAGNOSIS — Y939 Activity, unspecified: Secondary | ICD-10-CM | POA: Insufficient documentation

## 2016-06-26 DIAGNOSIS — Y929 Unspecified place or not applicable: Secondary | ICD-10-CM | POA: Diagnosis not present

## 2016-06-26 DIAGNOSIS — W208XXA Other cause of strike by thrown, projected or falling object, initial encounter: Secondary | ICD-10-CM | POA: Insufficient documentation

## 2016-06-26 DIAGNOSIS — S99922A Unspecified injury of left foot, initial encounter: Secondary | ICD-10-CM | POA: Diagnosis present

## 2016-06-26 DIAGNOSIS — Y998 Other external cause status: Secondary | ICD-10-CM | POA: Insufficient documentation

## 2016-06-26 DIAGNOSIS — F17200 Nicotine dependence, unspecified, uncomplicated: Secondary | ICD-10-CM | POA: Diagnosis not present

## 2016-06-26 DIAGNOSIS — S9032XA Contusion of left foot, initial encounter: Secondary | ICD-10-CM

## 2016-06-26 MED ORDER — IBUPROFEN 800 MG PO TABS
800.0000 mg | ORAL_TABLET | Freq: Once | ORAL | Status: AC
  Administered 2016-06-26: 800 mg via ORAL
  Filled 2016-06-26: qty 1

## 2016-06-26 MED ORDER — IBUPROFEN 600 MG PO TABS: 600.0000 mg | ORAL_TABLET | Freq: Four times a day (QID) | ORAL | 0 refills | Status: DC

## 2016-06-26 MED ORDER — ACETAMINOPHEN 500 MG PO TABS
1000.0000 mg | ORAL_TABLET | Freq: Once | ORAL | Status: AC
  Administered 2016-06-26: 1000 mg via ORAL
  Filled 2016-06-26: qty 2

## 2016-06-26 NOTE — Discharge Instructions (Signed)
Please keep your foot elevated above your waist as much as possible. Please apply ice today and tomorrow. Use your Ace bandage and postoperative shoe and crutches until you can safely apply weight to the foot. Please see Dr. Romeo AppleHarrison for orthopedic evaluation if not improving. Use 600 mg of ibuprofen and 500 mg of Tylenol every 6 hours for soreness and discomfort.

## 2016-06-26 NOTE — ED Triage Notes (Signed)
Pt dropped a heavy plate on her left foot around 3pm. Pt c/o sharp, shooting pain.

## 2016-06-26 NOTE — ED Provider Notes (Signed)
[facility] Provider Note   CSN: [id number] Arrival date & time: 06/26/16  2245     History   Chief Complaint Chief Complaint  Patient presents with  . Foot Injury    HPI Crystal Richards is a 56 y.o. female.   Foot Injury   The incident occurred 1 to 2 hours ago. The incident occurred at home. The injury mechanism was a direct blow (dropped heavy plate on left foot). The pain is present in the left foot. The quality of the pain is described as throbbing. The pain is moderate. The pain has been constant since onset. Associated symptoms include inability to bear weight. She reports no foreign bodies present. The symptoms are aggravated by bearing weight. She has tried elevation and ice for the symptoms.    Past Medical History:  Diagnosis Date  . High risk HPV infection   . 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   Past Surgical History:  Procedure Laterality Date  . CESAREAN SECTION    . COLPOSCOPY  02/2010   + ECC LGSIL fragment  . TONSILLECTOMY AND ADENOIDECTOMY    . TUBAL LIGATION      OB History    Gravida Para Term Preterm AB Living   3 3           SAB TAB Ectopic Multiple Live Births                   Home Medications    Prior to Admission medications   Medication Sig Start Date End Date Taking? Authorizing Provider  cephALEXin (KEFLEX) 500 MG capsule Take 1 capsule (500 mg total) by mouth 4 (four) times daily. 12/21/14   Burgess Amor, PA-C  clindamycin (CLEOCIN) 150 MG capsule Take 1 capsule (150 mg total) by mouth 4 (four) times daily. 03/14/16   Triplett, Tammy, PA-C  HYDROcodone-acetaminophen (NORCO/VICODIN) 5-325 MG tablet Take 1 tablet by mouth every 4 (four) hours as needed. Patient taking differently: Take 1 tablet by mouth every 4 (four) hours as needed for moderate pain or severe pain.  12/14/14   Burgess Amor, PA-C  naproxen (NAPROSYN) 500 MG tablet Take 1  tablet (500 mg total) by mouth 2 (two) times daily with a meal. 03/14/16   Triplett, Tammy, PA-C  phenazopyridine (PYRIDIUM) 200 MG tablet Take 1 tablet (200 mg total) by mouth 3 (three) times daily. Patient not taking: Reported on 12/21/2014 12/14/14   Burgess Amor, PA-C    Family History Family History  Problem Relation Age of Onset  . Diabetes Mother   . Hypertension Mother     Social History Social History  Substance Use Topics  . Smoking status: Current Every Day Smoker    Packs/day: 0.80  . Smokeless tobacco: Never Used  . Alcohol use Yes     Comment: occassionally     Allergies   Amoxicillin and Ciprofloxacin   Review of Systems Review of Systems  Constitutional: Negative for activity change.       All ROS Neg except as noted in HPI  HENT: Negative for nosebleeds.   Eyes: Negative for photophobia and discharge.  Respiratory: Negative for cough, shortness of breath and wheezing.   Cardiovascular: Negative for chest pain and palpitations.  Gastrointestinal: Negative for abdominal pain and blood in stool.  Genitourinary: Negative for dysuria, frequency and hematuria.  Musculoskeletal: Negative for arthralgias, back pain and neck pain.  Skin: Negative.  Neurological: Negative for dizziness, seizures and speech difficulty.  Psychiatric/Behavioral: Negative for confusion and hallucinations.     Physical Exam Updated Vital Signs BP (!) 179/87 (BP Location: Right Arm)   Pulse 67   Temp 98.2 F (36.8 C) (Oral)   Resp 18   Ht 5\' 6"  (1.676 m)   Wt 53.2 kg (117 lb 3 oz)   LMP 08/28/2010   SpO2 100%   BMI 18.91 kg/m   Physical Exam  Constitutional: She is oriented to person, place, and time. She appears well-developed and well-nourished.  Non-toxic appearance.  HENT:  Head: Normocephalic.  Right Ear: Tympanic membrane and external ear normal.  Left Ear: Tympanic membrane and external ear normal.  Eyes: EOM and lids are normal. Pupils are equal, round, and  reactive to light.  Neck: Normal range of motion. Neck supple. Carotid bruit is not present.  Cardiovascular: Normal rate, regular rhythm, normal heart sounds, intact distal pulses and normal pulses.   Pulmonary/Chest: Breath sounds normal. No respiratory distress.  Abdominal: Soft. Bowel sounds are normal. There is no tenderness. There is no guarding.  Musculoskeletal: Normal range of motion.  There is pain to palpation of the left first toe. There is pain to the dorsum of the left foot. The Achilles tendon is intact. The dorsalis pedis pulses 2+ and the capillary refill is less than 2 seconds. There no puncture wounds to the plantar surface of the foot.  Lymphadenopathy:       Head (right side): No submandibular adenopathy present.       Head (left side): No submandibular adenopathy present.    She has no cervical adenopathy.  Neurological: She is alert and oriented to person, place, and time. She has normal strength. No cranial nerve deficit or sensory deficit.  Skin: Skin is warm and dry.  Psychiatric: She has a normal mood and affect. Her speech is normal.  Nursing note and vitals reviewed.    ED Treatments / Results  Labs (all labs ordered are listed, but only abnormal results are displayed) Labs Reviewed - No data to display  EKG  EKG Interpretation None       Radiology No results found.  Procedures Procedures (including critical care time)  Medications Ordered in ED Medications - No data to display   Initial Impression / Assessment and Plan / ED Course  I have reviewed the triage vital signs and the nursing notes.  Pertinent labs & imaging results that were available during my care of the patient were reviewed by me and considered in my medical decision making (see chart for details).       Final Clinical Impressions(s) / ED Diagnoses MDM Patient dropped a heavy plate on the left foot and then accidentally kicked a cabinet. The x-ray is negative for fracture  or dislocation. There no neurovascular deficits appreciated. The patient is not on any anticoagulation medications or has history of any bleeding disorders. An Ace wrap was applied. The patient is fitted with a postoperative shoe and crutches. We discussed the importance of elevation and ice. The patient will use Tylenol and ibuprofen every 6 hours for discomfort. Patient is to follow with her primary physician or return to the emergency department if any changes or problems.    Final diagnoses:  Contusion of left foot, initial encounter    New Prescriptions New Prescriptions   IBUPROFEN (ADVIL,MOTRIN) 600 MG TABLET    Take 1 tablet (600 mg total) by mouth 4 (four) times daily.  Ivery Quale, PA-C 06/26/16 2350    Zadie Rhine, MD 06/27/16 [phone]

## 2017-11-25 ENCOUNTER — Emergency Department (HOSPITAL_COMMUNITY)
Admission: EM | Admit: 2017-11-25 | Discharge: 2017-11-25 | Disposition: A | Discharge status: Home / Self Care | Attending: Emergency Medicine | Admitting: Emergency Medicine

## 2017-11-25 ENCOUNTER — Encounter (HOSPITAL_COMMUNITY): Payer: Self-pay

## 2017-11-25 ENCOUNTER — Other Ambulatory Visit: Payer: Self-pay

## 2017-11-25 DIAGNOSIS — H5789 Other specified disorders of eye and adnexa: Secondary | ICD-10-CM | POA: Diagnosis not present

## 2017-11-25 DIAGNOSIS — F17210 Nicotine dependence, cigarettes, uncomplicated: Secondary | ICD-10-CM | POA: Insufficient documentation

## 2017-11-25 DIAGNOSIS — T7840XA Allergy, unspecified, initial encounter: Secondary | ICD-10-CM | POA: Insufficient documentation

## 2017-11-25 MED ORDER — FLUORESCEIN SODIUM 1 MG OP STRP
1.0000 | ORAL_STRIP | Freq: Once | OPHTHALMIC | Status: DC
  Filled 2017-11-25: qty 1

## 2017-11-25 MED ORDER — PREDNISONE 50 MG PO TABS: 50.0000 mg | ORAL_TABLET | Freq: Every day | ORAL | 0 refills | Status: AC

## 2017-11-25 MED ORDER — TETRACAINE HCL 0.5 % OP SOLN
2.0000 [drp] | Freq: Once | OPHTHALMIC | Status: DC
  Filled 2017-11-25: qty 4

## 2017-11-25 MED ORDER — CETIRIZINE HCL 10 MG PO TABS: 10.0000 mg | ORAL_TABLET | Freq: Every day | ORAL | 0 refills | Status: DC

## 2017-11-25 NOTE — ED Provider Notes (Signed)
[facility] EMERGENCY DEPARTMENT Provider Note   CSN: [id number] Arrival date & time: 11/25/17  0935     History   Chief Complaint Chief Complaint  Patient presents with  . Facial Swelling    HPI Crystal Richards is a 57 y.o. female with a history of tobacco abuse who presents to the emergency department with complaints of bilateral eye redness/swelling that started this AM. Patient states that she has had bilateral eye pruritus for the past couple of weeks, however no other symptoms.  She states that she went to bed feeling well last night and woke up this morning with erythema and swelling to the periorbital region bilaterally.  She states she has been itching at the eyes and they have been watering, no purulent drainage.  She states that these areas are not painful and she is not having significant visual changes.  There are no specific alleviating or aggravating factors to her symptoms.  She has not tried medication prior to arrival.  Denies change in vision, eye pain, purulent drainage, photophobia, nausea, vomiting, congestion, fever, or chills.  Patient is not a contact lens wearer.  She does wear glasses intermittently.  HPI  Past Medical History:  Diagnosis Date  . High risk HPV infection   . 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   Past Surgical History:  Procedure Laterality Date  . CESAREAN SECTION    . COLPOSCOPY  02/2010   + ECC LGSIL fragment  . TONSILLECTOMY AND ADENOIDECTOMY    . TUBAL LIGATION       OB History    Gravida  3   Para  3   Term      Preterm      AB      Living        SAB      TAB      Ectopic      Multiple      Live Births               Home Medications    Prior to Admission medications   Medication Sig Start Date End Date Taking? Authorizing Provider  cephALEXin (KEFLEX) 500 MG capsule Take 1 capsule (500 mg total) by mouth 4 (four)  times daily. 12/21/14   Burgess Amor, PA-C  clindamycin (CLEOCIN) 150 MG capsule Take 1 capsule (150 mg total) by mouth 4 (four) times daily. 03/14/16   Triplett, Tammy, PA-C  HYDROcodone-acetaminophen (NORCO/VICODIN) 5-325 MG tablet Take 1 tablet by mouth every 4 (four) hours as needed. Patient taking differently: Take 1 tablet by mouth every 4 (four) hours as needed for moderate pain or severe pain.  12/14/14   Burgess Amor, PA-C  ibuprofen (ADVIL,MOTRIN) 600 MG tablet Take 1 tablet (600 mg total) by mouth 4 (four) times daily. 06/26/16   Ivery Quale, PA-C  naproxen (NAPROSYN) 500 MG tablet Take 1 tablet (500 mg total) by mouth 2 (two) times daily with a meal. 03/14/16   Triplett, Tammy, PA-C  phenazopyridine (PYRIDIUM) 200 MG tablet Take 1 tablet (200 mg total) by mouth 3 (three) times daily. Patient not taking: Reported on 12/21/2014 12/14/14   Burgess Amor, PA-C   Family History Family History  Problem Relation Age of Onset  . Diabetes Mother   . Hypertension Mother     Social History Social History   Tobacco Use  . Smoking status: Current Every Day Smoker  Packs/day: 1.00  . Smokeless tobacco: Never Used  Substance Use Topics  . Alcohol use: Yes    Comment: occassionally  . Drug use: No     Allergies   Amoxicillin; Azithromycin; and Ciprofloxacin   Review of Systems Review of Systems  Constitutional: Negative for chills and fever.  HENT: Negative for congestion and sore throat.   Eyes: Positive for itching. Negative for photophobia, redness and visual disturbance.       Positive for periorbital swelling and erythema.   Physical Exam Updated Vital Signs BP (!) 160/68 (BP Location: Right Arm)   Pulse 81   Temp 97.9 F (36.6 C) (Oral)   Resp 12   Ht 5' 6.5" (1.689 m)   Wt 52.2 kg   LMP 08/28/2010   SpO2 98%   BMI 18.28 kg/m   Physical Exam  Constitutional: She appears well-developed and well-nourished.  Non-toxic appearance. No distress.  HENT:  Head:  Normocephalic and atraumatic.  Right Ear: Tympanic membrane normal.  Left Ear: Tympanic membrane normal.  Nose: Nose normal.  Mouth/Throat: Uvula is midline.  Eyes: Pupils are equal, round, and reactive to light. Conjunctivae and EOM are normal. Lids are everted and swept, no foreign bodies found. Right eye exhibits no discharge. Left eye exhibits no discharge.  Patient has periorbital erythema and swelling bilaterally, left greater than right.  This area is not warm to touch.  There is no palpable fluctuance or induration.  This area is nontender to palpation.  Her extraocular movements are intact and painless.  She has no proptosis on exam. Visual acuity: Bilateral Near: 20/15 R Near: 20/20 L Near: 20/30 Woods lamp: No forcing uptake on exam.  No abrasion or ulceration noted.  No hyphema or hypopyon.  Negative Seidel sign.    Neurological: She is alert.  Clear speech.   Psychiatric: She has a normal mood and affect. Her behavior is normal. Thought content normal.  Nursing note and vitals reviewed.    ED Treatments / Results  Labs (all labs ordered are listed, but only abnormal results are displayed) Labs Reviewed - No data to display  EKG None  Radiology No results found.  Procedures Procedures (including critical care time)  Medications Ordered in ED Medications - No data to display   Initial Impression / Assessment and Plan / ED Course  I have reviewed the triage vital signs and the nursing notes.  Pertinent labs & imaging results that were available during my care of the patient were reviewed by me and considered in my medical decision making (see chart for details).   Patient presents to the emergency department with bilateral periorbital erythema and swelling after a couple weeks worth of eye pruritus and watery drainage.  Patient nontoxic-appearing, no apparent distress, vitals WNL with the exception of elevated blood pressure, doubt HTN emergency, patient aware of  need for PCP recheck.  On exam she does have erythema/swelling to the periorbital area bilaterally, left greater than right.  She is afebrile and areas of erythema/swelling are not warm to touch or tender to palpation, EOMI, no evidence of entrapment of consensual photophobia, no proptosis, no visual acuity deficits, given these findings feel that bacterial orbital/periorbital cellulitis is less likely. There is no fluorescein uptake on exam, no indication of corneal abrasion/ulceration or HSV. Discussed findings with supervising physician Dr. Estell Harpin who personally evaluated patient- recommends steroid burst and zyrtec for likely allergic etiology. I discussed results, treatment plan, need for PCP follow-up, and return precautions with the patient.  Provided opportunity for questions, patient confirmed understanding and is in agreement with plan.   Final Clinical Impressions(s) / ED Diagnoses   Final diagnoses:  Allergic state, initial encounter    ED Discharge Orders         Ordered    predniSONE (DELTASONE) 50 MG tablet  Daily with breakfast     11/25/17 1516    cetirizine (ZYRTEC ALLERGY) 10 MG tablet  Daily     11/25/17 1516           Renie Stelmach, Pleas Koch, PA-C 11/25/17 1623    Bethann Berkshire, MD 11/26/17 1520

## 2017-11-25 NOTE — ED Triage Notes (Signed)
Pt has bilateral eye swelling that started last night. States they were itching and she rubbed them. States she ate lunch at [employer] and immediately after she started having eye itching. Is able to see fine.

## 2017-11-25 NOTE — Discharge Instructions (Addendum)
You were seen in the emergency department today for redness and swelling around her eyes.  At this time we suspect that this is allergy related.  We are sending you home with steroids (prednisone) to take 1 tablet daily for the next 5 days.  We also send you home with Zyrtec to take 1 tablet daily to help with allergies as well.  We have prescribed you new medication(s) today. Discuss the medications prescribed today with your pharmacist as they can have adverse effects and interactions with your other medicines including over the counter and prescribed medications. Seek medical evaluation if you start to experience new or abnormal symptoms after taking one of these medicines, seek care immediately if you start to experience difficulty breathing, feeling of your throat closing, facial swelling, or rash as these could be indications of a more serious allergic reaction  We would like you to follow-up closely with her primary care provider within 2 to 3 days for reevaluation, if you do not have a primary care provider there is information provided in your discharge instructions below regarding local primary care offices.  Return to the ER immediately should you experience spreading redness, warmth to these areas, pain to your eyes, change in vision, pus draining from around the eye, occult he with movement of the eye, bulging of the eye , fever, or any other concerns that you may have.   Additionally your blood pressure was elevated in the ER today, this is and that should be rechecked by primary care provider within 1 to 2 weeks.  [facility] Primary Care Doctor List    Kari Baars MD. Specialty: Pulmonary Disease Contact information: [address]  [address]  [place] [address]  [phone]   Syliva Overman, MD. Specialty: [facility] Medicine Contact information: [address]  [place] [address]  [phone]   Lilyan Punt, MD. Specialty: [facility] Medicine Contact  information: [address]  [place] [address]  [phone]   Avon Gully, MD Specialty: Internal Medicine Contact information: [address] [place] [address]  [phone]   Catalina Pizza, MD. Specialty: Internal Medicine Contact information: [address]  [place] [address]  [phone]    [facility] (Dr. Selena Batten) Specialty: Family Medicine Contact information: [address]  [place] [address]  [phone]   John Giovanni, MD. Specialty: [facility] Medicine Contact information: [address]  [address]  [place] [address]  [phone]   Carylon Perches, MD. Specialty: Internal Medicine Contact information: [address]  [address]  [place] [address]  [phone]    [facility]  [address] [place], [place] [address] [phone]  Services [facility] offers a variety of basic health services.  Services include but are not limited to: Blood pressure checks  Heart rate checks  Blood sugar checks  Urine analysis  Rapid strep tests  Pregnancy tests.  Health education and referrals  People needing more complex services will be directed to a physician online. Using these virtual visits, doctors can evaluate and prescribe medicine and treatments. There will be no medication on-site, though [facility] will help patients fill their prescriptions at little to no cost.   For More information please go to: [email or web address]

## 2017-12-17 ENCOUNTER — Other Ambulatory Visit: Payer: Self-pay

## 2017-12-17 ENCOUNTER — Encounter (HOSPITAL_COMMUNITY): Payer: Self-pay | Admitting: Emergency Medicine

## 2017-12-17 ENCOUNTER — Emergency Department (HOSPITAL_COMMUNITY)

## 2017-12-17 ENCOUNTER — Emergency Department (HOSPITAL_COMMUNITY)
Admission: EM | Admit: 2017-12-17 | Discharge: 2017-12-17 | Disposition: A | Discharge status: Home / Self Care | Attending: Emergency Medicine | Admitting: Emergency Medicine

## 2017-12-17 DIAGNOSIS — Y9389 Activity, other specified: Secondary | ICD-10-CM | POA: Diagnosis not present

## 2017-12-17 DIAGNOSIS — S40012A Contusion of left shoulder, initial encounter: Secondary | ICD-10-CM | POA: Diagnosis not present

## 2017-12-17 DIAGNOSIS — Y929 Unspecified place or not applicable: Secondary | ICD-10-CM | POA: Insufficient documentation

## 2017-12-17 DIAGNOSIS — Y999 Unspecified external cause status: Secondary | ICD-10-CM | POA: Insufficient documentation

## 2017-12-17 DIAGNOSIS — S4992XA Unspecified injury of left shoulder and upper arm, initial encounter: Secondary | ICD-10-CM | POA: Diagnosis present

## 2017-12-17 DIAGNOSIS — Z79899 Other long term (current) drug therapy: Secondary | ICD-10-CM | POA: Insufficient documentation

## 2017-12-17 DIAGNOSIS — W06XXXA Fall from bed, initial encounter: Secondary | ICD-10-CM | POA: Insufficient documentation

## 2017-12-17 DIAGNOSIS — F17210 Nicotine dependence, cigarettes, uncomplicated: Secondary | ICD-10-CM | POA: Diagnosis not present

## 2017-12-17 MED ORDER — OXYCODONE-ACETAMINOPHEN 5-325 MG PO TABS
1.0000 | ORAL_TABLET | Freq: Once | ORAL | Status: AC
  Administered 2017-12-17: 1 via ORAL
  Filled 2017-12-17: qty 1

## 2017-12-17 NOTE — Discharge Instructions (Signed)
Wear sling as needed.  Apply ice for 30 minutes, 3-4 times a day.  Take ibuprofen or naproxen as needed for pain. For additional pain relief, take acetaminophen in addition to ibuprofen or naproxen .

## 2017-12-17 NOTE — ED Provider Notes (Signed)
[facility] EMERGENCY DEPARTMENT Provider Note   CSN: [id number] Arrival date & time: 12/17/17  0345     History   Chief Complaint Chief Complaint  Patient presents with  . Shoulder Pain    HPI Crystal Richards is a 57 y.o. female.   The history is provided by the patient.  She woke up on the floor, apparently having fallen out of bed.  She is complaining of pain in the posterior aspect of her left shoulder which is worse with any movement of the left arm.  She denies head injury or neck injury.  She is not on any anticoagulants.  She rates pain at 6/10.  She has also noted numbness in her left second finger.  Past Medical History:  Diagnosis Date  . High risk HPV infection   . 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   Past Surgical History:  Procedure Laterality Date  . CESAREAN SECTION    . COLPOSCOPY  02/2010   + ECC LGSIL fragment  . TONSILLECTOMY AND ADENOIDECTOMY    . TUBAL LIGATION       OB History    Gravida  3   Para  3   Term      Preterm      AB      Living        SAB      TAB      Ectopic      Multiple      Live Births               Home Medications    Prior to Admission medications   Medication Sig Start Date End Date Taking? Authorizing Provider  cephALEXin (KEFLEX) 500 MG capsule Take 1 capsule (500 mg total) by mouth 4 (four) times daily. 12/21/14   Burgess Amor, PA-C  cetirizine (ZYRTEC ALLERGY) 10 MG tablet Take 1 tablet (10 mg total) by mouth daily. 11/25/17   Petrucelli, Samantha R, PA-C  clindamycin (CLEOCIN) 150 MG capsule Take 1 capsule (150 mg total) by mouth 4 (four) times daily. 03/14/16   Triplett, Tammy, PA-C  HYDROcodone-acetaminophen (NORCO/VICODIN) 5-325 MG tablet Take 1 tablet by mouth every 4 (four) hours as needed. Patient taking differently: Take 1 tablet by mouth every 4 (four) hours as needed for moderate pain or severe pain.   12/14/14   Burgess Amor, PA-C  ibuprofen (ADVIL,MOTRIN) 600 MG tablet Take 1 tablet (600 mg total) by mouth 4 (four) times daily. 06/26/16   Ivery Quale, PA-C  naproxen (NAPROSYN) 500 MG tablet Take 1 tablet (500 mg total) by mouth 2 (two) times daily with a meal. 03/14/16   Triplett, Tammy, PA-C    Family History Family History  Problem Relation Age of Onset  . Diabetes Mother   . Hypertension Mother     Social History Social History   Tobacco Use  . Smoking status: Current Every Day Smoker    Packs/day: 1.00  . Smokeless tobacco: Never Used  Substance Use Topics  . Alcohol use: Yes    Comment: occassionally  . Drug use: No     Allergies   Amoxicillin; Azithromycin; and Ciprofloxacin   Review of Systems Review of Systems  All other systems reviewed and are negative.    Physical Exam Updated Vital Signs BP 135/85   Pulse 77   Temp 98.2 F (36.8 C)   Resp 16   Ht 5'  5" (1.651 m)   Wt 52.1 kg   LMP 08/28/2010   SpO2 100%   BMI 19.11 kg/m    Physical Exam  Nursing note and vitals reviewed.  57 year old female, resting comfortably and in no acute distress. Vital signs are normal. Oxygen saturation is 100%, which is normal. Head is normocephalic and atraumatic. PERRLA, EOMI. Oropharynx is clear. Neck is nontender and supple without adenopathy or JVD. Back is nontender in the midline and there is no CVA tenderness.  There is localized tenderness over the superior aspect of the left scapula. Lungs are clear without rales, wheezes, or rhonchi. Chest is nontender. Heart has regular rate and rhythm without murmur. Abdomen is soft, flat, nontender without masses or hepatosplenomegaly and peristalsis is normoactive. Extremities have no cyanosis or edema, full range of motion is present.  There is no tenderness to palpation of the left shoulder, only the left scapular region. Skin is warm and dry without rash. Neurologic: Mental status is normal, cranial nerves are  intact, there are no motor or sensory deficits.  ED Treatments / Results   Radiology Dg Scapula Left  Result Date: 12/17/2017 CLINICAL DATA:  Left shoulder pain after fall out of bed this morning. EXAM: LEFT SCAPULA - 2+ VIEWS; LEFT SHOULDER - 2+ VIEW COMPARISON:  None. FINDINGS: Two views of the left scapula and three views of the left shoulder are obtained. The left shoulder and left scapula appear intact. Coracoclavicular and acromioclavicular spaces are maintained. No evidence of acute fracture or subluxation. No focal bone lesion or bone destruction. Bone cortex and trabecular architecture appear intact. No radiopaque soft tissue foreign bodies. IMPRESSION: No acute bony abnormalities demonstrated in the left shoulder or left scapula. Electronically Signed   By: Burman NievesWilliam  Stevens M.D.   On: 12/17/2017 04:41   Dg Shoulder Left  Result Date: 12/17/2017 CLINICAL DATA:  Left shoulder pain after fall out of bed this morning. EXAM: LEFT SCAPULA - 2+ VIEWS; LEFT SHOULDER - 2+ VIEW COMPARISON:  None. FINDINGS: Two views of the left scapula and three views of the left shoulder are obtained. The left shoulder and left scapula appear intact. Coracoclavicular and acromioclavicular spaces are maintained. No evidence of acute fracture or subluxation. No focal bone lesion or bone destruction. Bone cortex and trabecular architecture appear intact. No radiopaque soft tissue foreign bodies. IMPRESSION: No acute bony abnormalities demonstrated in the left shoulder or left scapula. Electronically Signed   By: Burman NievesWilliam  Stevens M.D.   On: 12/17/2017 04:41    Procedures Procedures   Medications Ordered in ED Medications  oxyCODONE-acetaminophen (PERCOCET/ROXICET) 5-325 MG per tablet 1 tablet (1 tablet Oral Given 12/17/17 0432)     Initial Impression / Assessment and Plan / ED Course  I have reviewed the triage vital signs and the nursing notes.  Pertinent labs & imaging results that were available during my  care of the patient were reviewed by me and considered in my medical decision making (see chart for details).  Fall from bed with apparent injury to left scapula.  She is being sent for x-rays.  Old records are reviewed, and she has no relevant past visits.  X-rays are negative for fracture.  She is given a sling for comfort and discharged with instructions to apply ice, take over-the-counter analgesics as needed for pain.  Follow-up with orthopedics if not improving.  Final Clinical Impressions(s) / ED Diagnoses   Final diagnoses:  Fall from bed, initial encounter  Contusion of left shoulder, initial encounter  ED Discharge Orders    None      Dione Booze, MD 12/17/17 [phone]

## 2017-12-17 NOTE — ED Triage Notes (Signed)
Pt c/o left shoulder pain after falling out of bed this am.

## 2017-12-22 ENCOUNTER — Observation Stay (HOSPITAL_COMMUNITY)
Admission: EM | Admit: 2017-12-22 | Discharge: 2017-12-25 | Disposition: A | Discharge status: Home / Self Care | Attending: Neurological Surgery | Admitting: Neurological Surgery

## 2017-12-22 ENCOUNTER — Encounter (HOSPITAL_COMMUNITY): Payer: Self-pay

## 2017-12-22 ENCOUNTER — Emergency Department (HOSPITAL_COMMUNITY)

## 2017-12-22 ENCOUNTER — Other Ambulatory Visit: Payer: Self-pay

## 2017-12-22 DIAGNOSIS — W06XXXA Fall from bed, initial encounter: Secondary | ICD-10-CM | POA: Diagnosis not present

## 2017-12-22 DIAGNOSIS — S12600A Unspecified displaced fracture of seventh cervical vertebra, initial encounter for closed fracture: Secondary | ICD-10-CM | POA: Diagnosis not present

## 2017-12-22 DIAGNOSIS — F17210 Nicotine dependence, cigarettes, uncomplicated: Secondary | ICD-10-CM | POA: Diagnosis not present

## 2017-12-22 DIAGNOSIS — S12500A Unspecified displaced fracture of sixth cervical vertebra, initial encounter for closed fracture: Principal | ICD-10-CM | POA: Insufficient documentation

## 2017-12-22 DIAGNOSIS — Z981 Arthrodesis status: Secondary | ICD-10-CM

## 2017-12-22 DIAGNOSIS — Z8781 Personal history of (healed) traumatic fracture: Secondary | ICD-10-CM

## 2017-12-22 DIAGNOSIS — Z419 Encounter for procedure for purposes other than remedying health state, unspecified: Secondary | ICD-10-CM

## 2017-12-22 MED ORDER — DOCUSATE SODIUM 100 MG PO CAPS
100.0000 mg | ORAL_CAPSULE | Freq: Two times a day (BID) | ORAL | Status: DC
  Administered 2017-12-23: 100 mg via ORAL
  Filled 2017-12-22 (×4): qty 1

## 2017-12-22 MED ORDER — MORPHINE SULFATE (PF) 2 MG/ML IV SOLN
1.0000 mg | INTRAVENOUS | Status: DC | PRN
  Administered 2017-12-22 – 2017-12-23 (×4): 1 mg via INTRAVENOUS
  Filled 2017-12-22 (×4): qty 1

## 2017-12-22 MED ORDER — OXYCODONE-ACETAMINOPHEN 5-325 MG PO TABS
1.0000 | ORAL_TABLET | Freq: Once | ORAL | Status: AC
  Administered 2017-12-22: 1 via ORAL
  Filled 2017-12-22: qty 1

## 2017-12-22 MED ORDER — SODIUM CHLORIDE 0.9 % IV SOLN
INTRAVENOUS | Status: DC
  Administered 2017-12-23 – 2017-12-24 (×2): via INTRAVENOUS

## 2017-12-22 MED ORDER — SENNA 8.6 MG PO TABS
1.0000 | ORAL_TABLET | Freq: Two times a day (BID) | ORAL | Status: DC
  Administered 2017-12-23 (×3): 8.6 mg via ORAL
  Filled 2017-12-22 (×5): qty 1

## 2017-12-22 MED ORDER — CYCLOBENZAPRINE HCL 10 MG PO TABS
10.0000 mg | ORAL_TABLET | Freq: Three times a day (TID) | ORAL | Status: DC | PRN
  Administered 2017-12-23 (×2): 10 mg via ORAL
  Filled 2017-12-22 (×2): qty 1

## 2017-12-22 MED ORDER — INFLUENZA VAC SPLIT QUAD 0.5 ML IM SUSY: 0.5000 mL | PREFILLED_SYRINGE | INTRAMUSCULAR | Status: DC

## 2017-12-22 MED ORDER — SODIUM CHLORIDE 0.9% FLUSH
3.0000 mL | Freq: Two times a day (BID) | INTRAVENOUS | Status: DC
  Administered 2017-12-22 – 2017-12-23 (×2): 3 mL via INTRAVENOUS

## 2017-12-22 MED ORDER — FENTANYL CITRATE (PF) 100 MCG/2ML IJ SOLN
50.0000 ug | Freq: Once | INTRAMUSCULAR | Status: AC
  Administered 2017-12-22: 50 ug via INTRAVENOUS
  Filled 2017-12-22: qty 2

## 2017-12-22 MED ORDER — ONDANSETRON HCL 4 MG PO TABS: 4.0000 mg | ORAL_TABLET | Freq: Four times a day (QID) | ORAL | Status: DC | PRN

## 2017-12-22 MED ORDER — HYDROCODONE-ACETAMINOPHEN 5-325 MG PO TABS
1.0000 | ORAL_TABLET | ORAL | Status: DC | PRN
  Administered 2017-12-23 – 2017-12-24 (×5): 1 via ORAL
  Filled 2017-12-22 (×5): qty 1

## 2017-12-22 MED ORDER — SODIUM CHLORIDE 0.9% FLUSH: 3.0000 mL | INTRAVENOUS | Status: DC | PRN

## 2017-12-22 MED ORDER — ACETAMINOPHEN 650 MG RE SUPP: 650.0000 mg | RECTAL | Status: DC | PRN

## 2017-12-22 MED ORDER — SODIUM CHLORIDE 0.9 % IV SOLN: 250.0000 mL | INTRAVENOUS | Status: DC

## 2017-12-22 MED ORDER — ACETAMINOPHEN 325 MG PO TABS: 650.0000 mg | ORAL_TABLET | ORAL | Status: DC | PRN

## 2017-12-22 MED ORDER — OXYCODONE HCL 5 MG PO TABS
10.0000 mg | ORAL_TABLET | ORAL | Status: DC | PRN
  Administered 2017-12-23 (×6): 10 mg via ORAL
  Filled 2017-12-22 (×6): qty 2

## 2017-12-22 MED ORDER — PNEUMOCOCCAL VAC POLYVALENT 25 MCG/0.5ML IJ INJ
0.5000 mL | INJECTION | INTRAMUSCULAR | Status: DC
Start: 2017-12-23 — End: 2017-12-22

## 2017-12-22 MED ORDER — ONDANSETRON HCL 4 MG/2ML IJ SOLN: 4.0000 mg | Freq: Four times a day (QID) | INTRAMUSCULAR | Status: DC | PRN

## 2017-12-22 MED ORDER — PANTOPRAZOLE SODIUM 40 MG IV SOLR
40.0000 mg | Freq: Every day | INTRAVENOUS | Status: DC
  Administered 2017-12-23: 40 mg via INTRAVENOUS
  Filled 2017-12-22: qty 40

## 2017-12-22 NOTE — ED Triage Notes (Signed)
Pt reports she fell Wednesday for fall. Pt reports increased pain in right upper arm. Pt reports numbness in left index finger

## 2017-12-22 NOTE — ED Notes (Signed)
Pt transported to CT ?

## 2017-12-22 NOTE — H&P (Signed)
Subjective:   Patient is a 57 y.o. female seen regarding neck pain after falling out of bed 5 days ago. The patient first presented with complaints of left shoulder pain. Onset of symptoms was 5 days ago, gradually worsening since that time. Onset was related to a fall. The pain is described as aching and occurs all day. The pain is rated severe, and is radiating down the left arm. The symptoms has been progressive. Symptoms are exacerbated by any activity, and are relieved by none. She was seen in the ED a couple days ago and sent home with pain management. Came back today after pain got worse and she developed numbness in her left 2nd and 3rd finger. Denies any weakness.   Past Medical History:  Diagnosis Date  . High risk HPV infection   . 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 CESAREAN SECTION    . COLPOSCOPY  02/2010   + ECC LGSIL fragment  . TONSILLECTOMY AND ADENOIDECTOMY    . TUBAL LIGATION      Allergies  Allergen Reactions  . Amoxicillin Diarrhea and Nausea And Vomiting  . Azithromycin Swelling    Facial Swelling   . Cephalexin Swelling  . Ciprofloxacin Rash    Social History   Tobacco Use  . Smoking status: Current Every Day Smoker    Packs/day: 1.00  . Smokeless tobacco: Never Used  Substance Use Topics  . Alcohol use: Yes    Comment: occassionally    Family History  Problem Relation Age of Onset  . Diabetes Mother   . Hypertension Mother    Prior to Admission medications   Medication Sig Start Date End Date Taking? Authorizing Provider  cetirizine (ZYRTEC ALLERGY) 10 MG tablet Take 1 tablet (10 mg total) by mouth daily. 11/25/17  Yes Petrucelli, Samantha R, PA-C  ibuprofen (ADVIL,MOTRIN) 200 MG tablet Take 800 mg by mouth once as needed for mild pain or moderate pain.   Yes [provider]  naproxen (NAPROSYN) 500 MG tablet Take 500 mg by mouth daily as needed for mild pain.    Yes [provider]     Review of Systems  Positive ROS: as above  All other systems have been reviewed and were otherwise negative with the exception of those mentioned in the HPI and as above.  Objective: Vital signs in last 24 hours: Temp:  [97.6 F (36.4 C)-98.5 F (36.9 C)] 98 F (36.7 C) (11/25 2138) Pulse Rate:  [59-109] 72 (11/25 2138) Resp:  [15-18] 18 (11/25 2011) BP: (102-164)/(82-94) 102/82 (11/25 2138) SpO2:  [97 %-100 %] 98 % (11/25 2138) Weight:  [52.2 kg] 52.2 kg (11/25 1242)  General Appearance: Alert, cooperative, no distress, appears stated age Head: Normocephalic, without obvious abnormality, atraumatic Eyes: PERRL, conjunctiva/corneas clear, EOM's intact, fundi benign, both eyes      Lungs:  respirations unlabored Heart: Regular rate and rhythm Pulses: 2+ and symmetric all extremities Skin: Skin color, texture, turgor normal, no rashes or lesions  NEUROLOGIC:   Mental status: A&O x4, no aphasia, good AS, fund of knowledge and memory Motor Exam - left tricep 3/5 Sensory Exam - grossly normal Reflexes: not tested Coordination - grossly normal Gait - not tested Balance -not tested Cranial Nerves: I: smell Not tested  II: visual acuity  OS: na    OD: na  II: visual fields Full to confrontation  II: pupils Equal, round, reactive to light  III,VII:  ptosis None  III,IV,VI: extraocular muscles  Full ROM  V: mastication   V: facial light touch sensation    V,VII: corneal reflex    VII: facial muscle function - upper    VII: facial muscle function - lower   VIII: hearing   IX: soft palate elevation    IX,X: gag reflex   XI: trapezius strength    XI: sternocleidomastoid strength   XI: neck flexion strength    XII: tongue strength      Data Review Lab Results  Component Value Date   WBC 11.9 (H) 12/14/2014   HGB 12.2 12/21/2014   HCT 36.0 12/21/2014   MCV 99.3 12/14/2014   PLT 279 12/14/2014   Lab Results  Component Value Date    NA 133 (L) 12/21/2014   K 3.9 12/21/2014   CL 94 (L) 12/21/2014   CO2 24 12/14/2014   BUN 10 12/21/2014   CREATININE 0.60 12/21/2014   GLUCOSE 107 (H) 12/21/2014   No results found for: INR, PROTIME  Assessment/Plan: 57 year old female presented to th ED today after sustaining a fall 5 days ago. Her pain has gradually gotten worse. CT showed bilateral lamina fx's at C6 with a perched facet on the left at C6-C7. She does have grade 2 anterolisthesis of C6-7. Will order MRI c spine for tomorrow. Plan for surgery this week. Bedrest and maintain aspen collar.    Tiana LoftKimberly Hannah Yemaya Barnier 12/22/2017 10:14 PM

## 2017-12-22 NOTE — ED Provider Notes (Signed)
[facility] EMERGENCY DEPARTMENT Provider Note   CSN: [id number] Arrival date & time: 12/22/17  1229     History   Chief Complaint Chief Complaint  Patient presents with  . Arm Pain    HPI Chasitee Zenker is a 57 y.o. female.  HPI  Samyria Rudie is a 57 y.o. female who presents to the Emergency Department complaining of continued pain of her left arm.  She was seen here on 12/17/2017 after a fall out of bed.  She was complaining of left arm pain at the time.  She states she has been applying ice packs to her arm and taking over-the-counter pain relievers without improvement.  She states she does have some pain with movement of her neck.  Arm pain is associated with numbness and tingling of her left index finger.  She denies vomiting, numbness of the arm, dizziness and headache.   Past Medical History:  Diagnosis Date  . High risk HPV infection   . 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   Past Surgical History:  Procedure Laterality Date  . CESAREAN SECTION    . COLPOSCOPY  02/2010   + ECC LGSIL fragment  . TONSILLECTOMY AND ADENOIDECTOMY    . TUBAL LIGATION       OB History    Gravida  3   Para  3   Term      Preterm      AB      Living        SAB      TAB      Ectopic      Multiple      Live Births               Home Medications    Prior to Admission medications   Medication Sig Start Date End Date Taking? Authorizing Provider  cetirizine (ZYRTEC ALLERGY) 10 MG tablet Take 1 tablet (10 mg total) by mouth daily. 11/25/17   Petrucelli, Samantha R, PA-C  ibuprofen (ADVIL,MOTRIN) 600 MG tablet Take 1 tablet (600 mg total) by mouth 4 (four) times daily. 06/26/16   Ivery Quale, PA-C  naproxen (NAPROSYN) 500 MG tablet Take 1 tablet (500 mg total) by mouth 2 (two) times daily with a meal. 03/14/16   Chyenne Sobczak, PA-C    Family History Family History  Problem  Relation Age of Onset  . Diabetes Mother   . Hypertension Mother     Social History Social History   Tobacco Use  . Smoking status: Current Every Day Smoker    Packs/day: 1.00  . Smokeless tobacco: Never Used  Substance Use Topics  . Alcohol use: Yes    Comment: occassionally  . Drug use: No     Allergies   Amoxicillin; Azithromycin; and Ciprofloxacin   Review of Systems Review of Systems  Constitutional: Negative for chills and fever.  Respiratory: Negative for shortness of breath.   Cardiovascular: Negative for chest pain.  Gastrointestinal: Negative for nausea and vomiting.  Musculoskeletal: Positive for arthralgias (Left arm pain) and neck pain. Negative for joint swelling.  Skin: Negative for color change and wound.  Neurological: Positive for numbness (Numbness and tingling of the left index finger). Negative for dizziness, syncope, speech difficulty and headaches.  Psychiatric/Behavioral: Negative for confusion.     Physical Exam Updated Vital Signs BP (!) 160/83 (BP Location: Right Arm)   Pulse (!) 109   Temp  97.6 F (36.4 C) (Oral)   Resp 16   Ht 5\' 7"  (1.702 m)   Wt 52.2 kg   LMP 08/28/2010   SpO2 100%   BMI 18.01 kg/m   Physical Exam  Constitutional: She appears well-nourished. No distress.  HENT:  Head: Atraumatic.  Eyes: Pupils are equal, round, and reactive to light. EOM are normal.  Cardiovascular: Normal rate, regular rhythm and intact distal pulses.  Pulmonary/Chest: Effort normal and breath sounds normal. She exhibits no tenderness.  Musculoskeletal:  Minimal tenderness to palpation over the cervical spine.  No step-off deformities.  Diffuse tenderness to the left upper arm and left shoulder.  Patient has full range of motion of the fingers of the left hand.  Cap refill less than 2 seconds.  Left wrist nontender.  Neurological: She is alert.  Tingling of left index finger  Skin: Skin is warm. Capillary refill takes less than 2 seconds.    Psychiatric: She has a normal mood and affect.  Nursing note and vitals reviewed.    ED Treatments / Results  Labs (all labs ordered are listed, but only abnormal results are displayed) Labs Reviewed - No data to display  EKG None  Radiology Ct Cervical Spine Wo Contrast  Result Date: 12/22/2017 CLINICAL DATA:  Right upper arm pain after fall last week. EXAM: CT CERVICAL SPINE WITHOUT CONTRAST TECHNIQUE: Multidetector CT imaging of the cervical spine was performed without intravenous contrast. Multiplanar CT image reconstructions were also generated. COMPARISON:  None. FINDINGS: Alignment: Grade 2 anterolisthesis of C6-7 is noted secondary to moderately displaced fracture involving the left posterior lamina of C6 and moderately displaced fracture involving the left-sided superior articular process of C7 and left-sided inferior articular process of C6. Skull base and vertebrae: Also noted is mildly displaced fracture involving the superior articular process of C7. Minimally displaced fracture involving right-sided posterior lamina of C6 is noted. Mildly displaced fracture is seen involving the left-sided transverse process of C7. Minimally displaced fracture is seen involving the right-sided transverse process of C7. Soft tissues and spinal canal: No prevertebral fluid or swelling. No visible canal hematoma. Disc levels:  Mild degenerative disc disease is noted at C6-7. Upper chest: Negative. Other: Mild degenerative changes seen involving the left-sided posterior facet joint of C7-T1. IMPRESSION: Grade 2 anterolisthesis of C6-7 is noted secondary to moderately displaced fracture involving the left posterior lamina of C6 with associated moderately displaced fractures involving the left-sided superior articular process C7 and inferior articular processes C6. Also noted is minimally displaced fracture involving right-sided posterior lamina of C6 with mildly displaced fracture involving superior  articular process of C7. Minimally to mildly displaced fractures are seen involving bilateral transverse processes of C7. Critical Value/emergent results were called by telephone at the time of interpretation on 12/22/2017 at 3:28 pm to Dr. Eustace Pen, who verbally acknowledged these results. Electronically Signed   By: Lupita Raider, M.D.   On: 12/22/2017 15:29    Procedures Procedures (including critical care time)  Medications Ordered in ED Medications  oxyCODONE-acetaminophen (PERCOCET/ROXICET) 5-325 MG per tablet 1 tablet (1 tablet Oral Given 12/22/17 1550)     Initial Impression / Assessment and Plan / ED Course  I have reviewed the triage vital signs and the nursing notes.  Pertinent labs & imaging results that were available during my care of the patient were reviewed by me and considered in my medical decision making (see chart for details).     1535 I was informed by my attending, Dr.  Zackowski of the radiology findings on patient's CT C-spine.  Patient was placed in a hard cervical collar.  She remains neurovascularly intact, but continues to have numbness and tingling of the left index finger.  I will consult neurosurgery.  1740 consulted Dr. Yetta BarreJones from neurosurgery and also spoke with his PA.  He request [facility] to [facility]. progressive.  Patient agrees to plan.  Final Clinical Impressions(s) / ED Diagnoses   Final diagnoses:  Closed displaced fracture of sixth cervical vertebra, unspecified fracture morphology, initial encounter (HCC)  Closed displaced fracture of seventh cervical vertebra, unspecified fracture morphology, initial encounter [facility](HCC)    ED Discharge Orders    None       Pauline Ausriplett, Delmo Matty, PA-C 12/22/17 1749    Vanetta MuldersZackowski, Scott, MD 12/30/17 [phone]

## 2017-12-23 ENCOUNTER — Other Ambulatory Visit: Payer: Self-pay | Admitting: Neurological Surgery

## 2017-12-23 ENCOUNTER — Encounter (HOSPITAL_COMMUNITY): Payer: Self-pay | Admitting: Radiology

## 2017-12-23 ENCOUNTER — Inpatient Hospital Stay (HOSPITAL_COMMUNITY)

## 2017-12-23 MED ORDER — PANTOPRAZOLE SODIUM 40 MG PO TBEC
40.0000 mg | DELAYED_RELEASE_TABLET | Freq: Every day | ORAL | Status: DC
  Administered 2017-12-23: 40 mg via ORAL
  Filled 2017-12-23: qty 1

## 2017-12-23 MED ORDER — DEXAMETHASONE SODIUM PHOSPHATE 4 MG/ML IJ SOLN
4.0000 mg | Freq: Four times a day (QID) | INTRAMUSCULAR | Status: DC
  Administered 2017-12-23 – 2017-12-24 (×3): 4 mg via INTRAVENOUS
  Filled 2017-12-23 (×3): qty 1

## 2017-12-23 NOTE — Progress Notes (Signed)
Patient ID: Crystal Richards, female   DOB: [birth year], 57 y.o.   MRN: [id number] Subjective: Patient reports worsening pain in left arm.  Some numbness and tingling in the left index finger.  Objective: Vital signs in last 24 hours: Temp:  [98 F (36.7 C)-98.5 F (36.9 C)] 98 F (36.7 C) (11/26 1548) Pulse Rate:  [59-81] 81 (11/26 1548) Resp:  [15-18] 18 (11/26 1548) BP: (102-164)/(72-94) 143/80 (11/26 1548) SpO2:  [95 %-100 %] 95 % (11/26 1548)  Intake/Output from previous day: 11/25 0701 - 11/26 0700 In: 280 [I.V.:280] Out: -  Intake/Output this shift: Total I/O In: 320 [P.O.:320] Out: -   Mains in cervical collar, left tricep 3 out of 5, otherwise good strength.  Lab Results: Lab Results  Component Value Date   WBC 11.9 (H) 12/14/2014   HGB 12.2 12/21/2014   HCT 36.0 12/21/2014   MCV 99.3 12/14/2014   PLT 279 12/14/2014   No results found for: INR, PROTIME BMET Lab Results  Component Value Date   NA 133 (L) 12/21/2014   K 3.9 12/21/2014   CL 94 (L) 12/21/2014   CO2 24 12/14/2014   GLUCOSE 107 (H) 12/21/2014   BUN 10 12/21/2014   CREATININE 0.60 12/21/2014   CALCIUM 9.4 12/14/2014    Studies/Results: Ct Cervical Spine Wo Contrast  Result Date: 12/22/2017 CLINICAL DATA:  Right upper arm pain after fall last week. EXAM: CT CERVICAL SPINE WITHOUT CONTRAST TECHNIQUE: Multidetector CT imaging of the cervical spine was performed without intravenous contrast. Multiplanar CT image reconstructions were also generated. COMPARISON:  None. FINDINGS: Alignment: Grade 2 anterolisthesis of C6-7 is noted secondary to moderately displaced fracture involving the left posterior lamina of C6 and moderately displaced fracture involving the left-sided superior articular process of C7 and left-sided inferior articular process of C6. Skull base and vertebrae: Also noted is mildly displaced fracture involving the superior articular process of C7. Minimally displaced fracture involving  right-sided posterior lamina of C6 is noted. Mildly displaced fracture is seen involving the left-sided transverse process of C7. Minimally displaced fracture is seen involving the right-sided transverse process of C7. Soft tissues and spinal canal: No prevertebral fluid or swelling. No visible canal hematoma. Disc levels:  Mild degenerative disc disease is noted at C6-7. Upper chest: Negative. Other: Mild degenerative changes seen involving the left-sided posterior facet joint of C7-T1. IMPRESSION: Grade 2 anterolisthesis of C6-7 is noted secondary to moderately displaced fracture involving the left posterior lamina of C6 with associated moderately displaced fractures involving the left-sided superior articular process C7 and inferior articular processes C6. Also noted is minimally displaced fracture involving right-sided posterior lamina of C6 with mildly displaced fracture involving superior articular process of C7. Minimally to mildly displaced fractures are seen involving bilateral transverse processes of C7. Critical Value/emergent results were called by telephone at the time of interpretation on 12/22/2017 at 3:28 pm to Dr. Eustace PenZickowski, who verbally acknowledged these results. Electronically Signed   By: Lupita RaiderJames  Green Jr, M.D.   On: 12/22/2017 15:29   Mr Cervical Spine Wo Contrast  Result Date: 12/23/2017 CLINICAL DATA:  Cervical spine fracture. Fall 5 days ago. Left arm pain. EXAM: MRI CERVICAL SPINE WITHOUT CONTRAST TECHNIQUE: Multiplanar, multisequence MR imaging of the cervical spine was performed. No intravenous contrast was administered. COMPARISON:  CT cervical spine 12/22/2017 FINDINGS: Alignment: 5 mm anterolisthesis C6-7 related to fractures. Remaining alignment normal. Image quality degraded by motion. Vertebrae: Fractures are better seen on recent CT. In summary, there are bilateral laminar fractures  of C6. Displaced facet fracture on the left at C6-7. Superior articulating facet of C7 projects  into the foramen with severe left foraminal stenosis. Possible small extradural hematoma on the left at C6-7. Bilateral C7 transverse process fractures. Fracture of the inferior articulating facet of C6 on the left. Cord: Negative for cord compression. Limited cord evaluation due to motion. No cord edema identified. Posterior Fossa, vertebral arteries, paraspinal tissues: Negative Disc levels: C2-3: Negative C3-4: Negative C4-5: Negative C5-6: Mild disc degeneration. Mild left facet degeneration without stenosis C6-7: Fracture dislocation as above with severe left foraminal encroachment due to bony fragment possible extradural hemorrhage. Cord flattening and mild spinal stenosis. C7-T1: Bilateral facet degeneration.  Negative for stenosis. IMPRESSION: 1. Image quality degraded by motion. Limited cord evaluation. No evidence of cord edema. The patient has a relatively large spinal canal 2. Fracture dislocation at C6-7 on the left. Bone fragment extending in the left foramen with marked left foraminal encroachment and possible small extradural hemorrhage in the foramen. Mild cord flattening and mild spinal stenosis. 3. Mild degenerative change elsewhere without additional cervical spine stenosis. Electronically Signed   By: Marlan Palau M.D.   On: 12/23/2017 15:34    Assessment/Plan: MRI reviewed.  Recommend surgery tomorrow.  We will plan ACDF with plating at C6-7 and hope that we can reduce this intraoperatively.  She understands that if we have trouble reducing it from an anterior approach we may need to reduce it from a posterior approach.  I have tried to describe the surgery to her.  She understands the risk of the surgery include but are not limited to bleeding, infection, nerve injury, spinal cord injury, CSF leak, numbness, weakness, paralysis, recurrent laryngeal nerve injury, dysphagia, hoarseness, esophageal injury, tracheal injury, carotid artery or vertebral artery injury, stroke, pseudoarthrosis,  hardware failure, misplaced hardware, need for further surgery, lack of relief of symptoms, worsening symptoms, and anesthesia risk including DVT pneumonia MI and death.  She agrees to proceed  Estimated body mass index is 18.01 kg/m as calculated from the following:   Height as of this encounter: 5\' 7"  (1.702 m).   Weight as of this encounter: 52.2 kg.    LOS: 1 day    Geriann Lafont S 12/23/2017, 5:12 PM

## 2017-12-24 ENCOUNTER — Inpatient Hospital Stay (HOSPITAL_COMMUNITY): Admitting: Certified Registered Nurse Anesthetist

## 2017-12-24 ENCOUNTER — Inpatient Hospital Stay (HOSPITAL_COMMUNITY)

## 2017-12-24 ENCOUNTER — Encounter (HOSPITAL_COMMUNITY): Payer: Self-pay | Admitting: Certified Registered Nurse Anesthetist

## 2017-12-24 ENCOUNTER — Encounter (HOSPITAL_COMMUNITY)
Admission: EM | Disposition: A | Discharge status: Home / Self Care | Payer: Self-pay | Source: Home / Self Care | Attending: Emergency Medicine

## 2017-12-24 DIAGNOSIS — Z981 Arthrodesis status: Secondary | ICD-10-CM

## 2017-12-24 HISTORY — PX: ANTERIOR CERVICAL DECOMP/DISCECTOMY FUSION: SHX1161

## 2017-12-24 SURGERY — ANTERIOR CERVICAL DECOMPRESSION/DISCECTOMY FUSION 1 LEVEL
Anesthesia: General | Site: Spine Cervical

## 2017-12-24 MED ORDER — SUGAMMADEX SODIUM 200 MG/2ML IV SOLN
INTRAVENOUS | Status: DC | PRN
  Administered 2017-12-24: 100 mg via INTRAVENOUS

## 2017-12-24 MED ORDER — SODIUM CHLORIDE 0.9% FLUSH
3.0000 mL | Freq: Two times a day (BID) | INTRAVENOUS | Status: DC
  Administered 2017-12-24: 3 mL via INTRAVENOUS

## 2017-12-24 MED ORDER — VANCOMYCIN HCL IN DEXTROSE 1-5 GM/200ML-% IV SOLN
INTRAVENOUS | Status: AC
  Filled 2017-12-24: qty 200

## 2017-12-24 MED ORDER — ROCURONIUM BROMIDE 50 MG/5ML IV SOSY
PREFILLED_SYRINGE | INTRAVENOUS | Status: AC
  Filled 2017-12-24: qty 5

## 2017-12-24 MED ORDER — THROMBIN 5000 UNITS EX SOLR
OROMUCOSAL | Status: DC | PRN
  Administered 2017-12-24: 15:00:00 via TOPICAL

## 2017-12-24 MED ORDER — 0.9 % SODIUM CHLORIDE (POUR BTL) OPTIME
TOPICAL | Status: DC | PRN
  Administered 2017-12-24: 1000 mL

## 2017-12-24 MED ORDER — LIDOCAINE 2% (20 MG/ML) 5 ML SYRINGE
INTRAMUSCULAR | Status: AC
  Filled 2017-12-24: qty 5

## 2017-12-24 MED ORDER — ACETAMINOPHEN 325 MG PO TABS: 650.0000 mg | ORAL_TABLET | ORAL | Status: DC | PRN

## 2017-12-24 MED ORDER — ONDANSETRON HCL 4 MG/2ML IJ SOLN
INTRAMUSCULAR | Status: DC | PRN
  Administered 2017-12-24: 4 mg via INTRAVENOUS

## 2017-12-24 MED ORDER — FENTANYL CITRATE (PF) 100 MCG/2ML IJ SOLN
INTRAMUSCULAR | Status: DC | PRN
  Administered 2017-12-24 (×4): 50 ug via INTRAVENOUS

## 2017-12-24 MED ORDER — VANCOMYCIN HCL IN DEXTROSE 1-5 GM/200ML-% IV SOLN
1000.0000 mg | Freq: Once | INTRAVENOUS | Status: AC
  Administered 2017-12-25: 1000 mg via INTRAVENOUS
  Filled 2017-12-24: qty 200

## 2017-12-24 MED ORDER — SENNA 8.6 MG PO TABS
1.0000 | ORAL_TABLET | Freq: Two times a day (BID) | ORAL | Status: DC
  Administered 2017-12-24 – 2017-12-25 (×2): 8.6 mg via ORAL
  Filled 2017-12-24 (×2): qty 1

## 2017-12-24 MED ORDER — OXYCODONE HCL 5 MG PO TABS: 5.0000 mg | ORAL_TABLET | Freq: Once | ORAL | Status: DC | PRN

## 2017-12-24 MED ORDER — HEMOSTATIC AGENTS (NO CHARGE) OPTIME
TOPICAL | Status: DC | PRN
  Administered 2017-12-24: 1 via TOPICAL

## 2017-12-24 MED ORDER — SODIUM CHLORIDE 0.9% FLUSH: 3.0000 mL | INTRAVENOUS | Status: DC | PRN

## 2017-12-24 MED ORDER — DEXAMETHASONE SODIUM PHOSPHATE 10 MG/ML IJ SOLN
INTRAMUSCULAR | Status: DC | PRN
  Administered 2017-12-24: 10 mg via INTRAVENOUS

## 2017-12-24 MED ORDER — DEXAMETHASONE SODIUM PHOSPHATE 10 MG/ML IJ SOLN
INTRAMUSCULAR | Status: AC
  Filled 2017-12-24: qty 1

## 2017-12-24 MED ORDER — CHLORHEXIDINE GLUCONATE CLOTH 2 % EX PADS: 6.0000 | MEDICATED_PAD | Freq: Once | CUTANEOUS | Status: DC

## 2017-12-24 MED ORDER — OXYCODONE HCL 5 MG/5ML PO SOLN: 5.0000 mg | Freq: Once | ORAL | Status: DC | PRN

## 2017-12-24 MED ORDER — ONDANSETRON HCL 4 MG/2ML IJ SOLN
INTRAMUSCULAR | Status: AC
  Filled 2017-12-24: qty 4

## 2017-12-24 MED ORDER — THROMBIN 5000 UNITS EX SOLR
CUTANEOUS | Status: DC | PRN
  Administered 2017-12-24 (×2): 5000 [IU] via TOPICAL

## 2017-12-24 MED ORDER — ARTIFICIAL TEARS OPHTHALMIC OINT
TOPICAL_OINTMENT | OPHTHALMIC | Status: AC
  Filled 2017-12-24: qty 3.5

## 2017-12-24 MED ORDER — POTASSIUM CHLORIDE IN NACL 20-0.9 MEQ/L-% IV SOLN
INTRAVENOUS | Status: DC
  Administered 2017-12-25: 04:00:00 via INTRAVENOUS
  Filled 2017-12-24: qty 1000

## 2017-12-24 MED ORDER — DEXAMETHASONE SODIUM PHOSPHATE 4 MG/ML IJ SOLN
4.0000 mg | Freq: Four times a day (QID) | INTRAMUSCULAR | Status: DC
  Filled 2017-12-24: qty 1

## 2017-12-24 MED ORDER — DEXAMETHASONE 4 MG PO TABS
4.0000 mg | ORAL_TABLET | Freq: Four times a day (QID) | ORAL | Status: DC
  Administered 2017-12-24 – 2017-12-25 (×3): 4 mg via ORAL
  Filled 2017-12-24 (×3): qty 1

## 2017-12-24 MED ORDER — ONDANSETRON HCL 4 MG/2ML IJ SOLN: 4.0000 mg | Freq: Four times a day (QID) | INTRAMUSCULAR | Status: DC | PRN

## 2017-12-24 MED ORDER — ONDANSETRON HCL 4 MG/2ML IJ SOLN: 4.0000 mg | Freq: Once | INTRAMUSCULAR | Status: DC | PRN

## 2017-12-24 MED ORDER — FENTANYL CITRATE (PF) 250 MCG/5ML IJ SOLN
INTRAMUSCULAR | Status: AC
  Filled 2017-12-24: qty 5

## 2017-12-24 MED ORDER — PROPOFOL 10 MG/ML IV BOLUS
INTRAVENOUS | Status: DC | PRN
  Administered 2017-12-24: 30 mg via INTRAVENOUS
  Administered 2017-12-24: 150 mg via INTRAVENOUS

## 2017-12-24 MED ORDER — SODIUM CHLORIDE 0.9 % IV SOLN
INTRAVENOUS | Status: DC | PRN
  Administered 2017-12-24: 15:00:00

## 2017-12-24 MED ORDER — PROPOFOL 10 MG/ML IV BOLUS
INTRAVENOUS | Status: AC
  Filled 2017-12-24: qty 40

## 2017-12-24 MED ORDER — THROMBIN 5000 UNITS EX SOLR
CUTANEOUS | Status: AC
  Filled 2017-12-24: qty 5000

## 2017-12-24 MED ORDER — ROCURONIUM BROMIDE 50 MG/5ML IV SOSY
PREFILLED_SYRINGE | INTRAVENOUS | Status: DC | PRN
  Administered 2017-12-24: 10 mg via INTRAVENOUS
  Administered 2017-12-24: 50 mg via INTRAVENOUS
  Administered 2017-12-24: 10 mg via INTRAVENOUS

## 2017-12-24 MED ORDER — VANCOMYCIN HCL IN DEXTROSE 1-5 GM/200ML-% IV SOLN
1000.0000 mg | INTRAVENOUS | Status: AC
  Administered 2017-12-24: 1000 mg via INTRAVENOUS

## 2017-12-24 MED ORDER — SODIUM CHLORIDE 0.9 % IV SOLN
250.0000 mL | INTRAVENOUS | Status: DC
  Administered 2017-12-24: 250 mL via INTRAVENOUS

## 2017-12-24 MED ORDER — PHENOL 1.4 % MT LIQD: 1.0000 | OROMUCOSAL | Status: DC | PRN

## 2017-12-24 MED ORDER — ONDANSETRON HCL 4 MG PO TABS: 4.0000 mg | ORAL_TABLET | Freq: Four times a day (QID) | ORAL | Status: DC | PRN

## 2017-12-24 MED ORDER — MIDAZOLAM HCL 5 MG/5ML IJ SOLN
INTRAMUSCULAR | Status: DC | PRN
  Administered 2017-12-24: 2 mg via INTRAVENOUS

## 2017-12-24 MED ORDER — OXYCODONE HCL 5 MG PO TABS
5.0000 mg | ORAL_TABLET | ORAL | Status: DC | PRN
  Administered 2017-12-25: 5 mg via ORAL
  Filled 2017-12-24: qty 1

## 2017-12-24 MED ORDER — BUPIVACAINE HCL (PF) 0.25 % IJ SOLN
INTRAMUSCULAR | Status: AC
  Filled 2017-12-24: qty 30

## 2017-12-24 MED ORDER — BUPIVACAINE HCL (PF) 0.25 % IJ SOLN
INTRAMUSCULAR | Status: DC | PRN
  Administered 2017-12-24: 4 mL

## 2017-12-24 MED ORDER — MENTHOL 3 MG MT LOZG: 1.0000 | LOZENGE | OROMUCOSAL | Status: DC | PRN

## 2017-12-24 MED ORDER — METHOCARBAMOL 500 MG PO TABS: 500.0000 mg | ORAL_TABLET | Freq: Four times a day (QID) | ORAL | Status: DC | PRN

## 2017-12-24 MED ORDER — LIDOCAINE 2% (20 MG/ML) 5 ML SYRINGE
INTRAMUSCULAR | Status: DC | PRN
  Administered 2017-12-24: 40 mg via INTRAVENOUS

## 2017-12-24 MED ORDER — METHOCARBAMOL 1000 MG/10ML IJ SOLN
500.0000 mg | Freq: Four times a day (QID) | INTRAVENOUS | Status: DC | PRN
  Filled 2017-12-24: qty 5

## 2017-12-24 MED ORDER — THROMBIN 5000 UNITS EX SOLR
CUTANEOUS | Status: AC
  Filled 2017-12-24: qty 10000

## 2017-12-24 MED ORDER — MORPHINE SULFATE (PF) 2 MG/ML IV SOLN: 2.0000 mg | INTRAVENOUS | Status: DC | PRN

## 2017-12-24 MED ORDER — LACTATED RINGERS IV SOLN
INTRAVENOUS | Status: DC
  Administered 2017-12-24: 13:00:00 via INTRAVENOUS

## 2017-12-24 MED ORDER — FENTANYL CITRATE (PF) 100 MCG/2ML IJ SOLN: 25.0000 ug | INTRAMUSCULAR | Status: DC | PRN

## 2017-12-24 MED ORDER — ACETAMINOPHEN 650 MG RE SUPP: 650.0000 mg | RECTAL | Status: DC | PRN

## 2017-12-24 MED ORDER — MIDAZOLAM HCL 2 MG/2ML IJ SOLN
INTRAMUSCULAR | Status: AC
  Filled 2017-12-24: qty 2

## 2017-12-24 SURGICAL SUPPLY — 52 items
BAG DECANTER FOR FLEXI CONT (MISCELLANEOUS) ×3 IMPLANT
BASKET BONE COLLECTION (BASKET) IMPLANT
BENZOIN TINCTURE PRP APPL 2/3 (GAUZE/BANDAGES/DRESSINGS) ×3 IMPLANT
BIT DRILL 2.3 12 FIXED (INSTRUMENTS) ×1 IMPLANT
BUR MATCHSTICK NEURO 3.0 LAGG (BURR) ×3 IMPLANT
CANISTER SUCT 3000ML PPV (MISCELLANEOUS) ×3 IMPLANT
CARTRIDGE OIL MAESTRO DRILL (MISCELLANEOUS) ×1 IMPLANT
CLOSURE WOUND 1/2 X4 (GAUZE/BANDAGES/DRESSINGS) ×1
COVER WAND RF STERILE (DRAPES) ×3 IMPLANT
DIFFUSER DRILL AIR PNEUMATIC (MISCELLANEOUS) ×3 IMPLANT
DRAPE C-ARM 42X72 X-RAY (DRAPES) ×6 IMPLANT
DRAPE LAPAROTOMY 100X72 PEDS (DRAPES) ×3 IMPLANT
DRAPE MICROSCOPE LEICA (MISCELLANEOUS) ×3 IMPLANT
DRILL 12MM (INSTRUMENTS) ×3
DRSG OPSITE POSTOP 3X4 (GAUZE/BANDAGES/DRESSINGS) ×3 IMPLANT
DURAPREP 6ML APPLICATOR 50/CS (WOUND CARE) ×3 IMPLANT
ELECT COATED BLADE 2.86 ST (ELECTRODE) ×3 IMPLANT
ELECT REM PT RETURN 9FT ADLT (ELECTROSURGICAL) ×3
ELECTRODE REM PT RTRN 9FT ADLT (ELECTROSURGICAL) ×1 IMPLANT
GAUZE 4X4 16PLY RFD (DISPOSABLE) IMPLANT
GLOVE BIO SURGEON STRL SZ7 (GLOVE) IMPLANT
GLOVE BIO SURGEON STRL SZ8 (GLOVE) ×3 IMPLANT
GLOVE BIOGEL PI IND STRL 7.0 (GLOVE) IMPLANT
GLOVE BIOGEL PI INDICATOR 7.0 (GLOVE)
GOWN STRL REUS W/ TWL LRG LVL3 (GOWN DISPOSABLE) IMPLANT
GOWN STRL REUS W/ TWL XL LVL3 (GOWN DISPOSABLE) IMPLANT
GOWN STRL REUS W/TWL 2XL LVL3 (GOWN DISPOSABLE) ×3 IMPLANT
GOWN STRL REUS W/TWL LRG LVL3 (GOWN DISPOSABLE)
GOWN STRL REUS W/TWL XL LVL3 (GOWN DISPOSABLE)
HEMOSTAT POWDER KIT SURGIFOAM (HEMOSTASIS) ×3 IMPLANT
KIT BASIN OR (CUSTOM PROCEDURE TRAY) ×3 IMPLANT
KIT TURNOVER KIT B (KITS) ×3 IMPLANT
NEEDLE HYPO 25X1 1.5 SAFETY (NEEDLE) ×3 IMPLANT
NEEDLE SPNL 20GX3.5 QUINCKE YW (NEEDLE) ×3 IMPLANT
NS IRRIG 1000ML POUR BTL (IV SOLUTION) ×3 IMPLANT
OIL CARTRIDGE MAESTRO DRILL (MISCELLANEOUS) ×3
PACK LAMINECTOMY NEURO (CUSTOM PROCEDURE TRAY) ×3 IMPLANT
PAD ARMBOARD 7.5X6 YLW CONV (MISCELLANEOUS) ×3 IMPLANT
PIN DISTRACTION 14MM (PIN) ×6 IMPLANT
PLATE 14MM (Plate) ×3 IMPLANT
PUTTY BONE 1CC ×3 IMPLANT
RUBBERBAND STERILE (MISCELLANEOUS) ×6 IMPLANT
SCREW BONE CANN 14 SS SD STR (Screw) ×12 IMPLANT
SPACER PTI-C 8X16X14 7DEG (Spacer) ×3 IMPLANT
SPONGE INTESTINAL PEANUT (DISPOSABLE) ×3 IMPLANT
SPONGE SURGIFOAM ABS GEL SZ50 (HEMOSTASIS) ×3 IMPLANT
STRIP CLOSURE SKIN 1/2X4 (GAUZE/BANDAGES/DRESSINGS) ×2 IMPLANT
SUT VIC AB 3-0 SH 8-18 (SUTURE) ×3 IMPLANT
SUT VICRYL 4-0 PS2 18IN ABS (SUTURE) IMPLANT
TOWEL GREEN STERILE (TOWEL DISPOSABLE) ×3 IMPLANT
TOWEL GREEN STERILE FF (TOWEL DISPOSABLE) ×3 IMPLANT
WATER STERILE IRR 1000ML POUR (IV SOLUTION) ×3 IMPLANT

## 2017-12-24 NOTE — Op Note (Signed)
12/24/2017  3:24 PM  PATIENT:  Crystal Richards  57 y.o. female  PRE-OPERATIVE DIAGNOSIS:  Cervical spine fracture C6-7 with jumped facet, neck and left arm pain with triceps weakness  POST-OPERATIVE DIAGNOSIS:  same  PROCEDURE:  1. Decompressive anterior cervical discectomy C6-7, 2. Anterior cervical arthrodesis C6-7 utilizing a porous titanium interbody cage packed with locally harvested morcellized autologous bone graft mixed with DBM putty, 3. Anterior cervical plating C6-7 utilizing atec plate  SURGEON:  Marikay Alaravid Anissa Abbs, MD  ASSISTANTS: ostergard md  ANESTHESIA:   General  EBL: 25 ml  Total I/O In: 700 [I.V.:700] Out: 25 [Blood:25]  BLOOD ADMINISTERED: none  DRAINS: none  SPECIMEN:  none  INDICATION FOR PROCEDURE: This patient presented with neck and left arm pain after a fall out of bed. Imaging showed left C6-7 facet fracture with perched facets are locked facets and grade 2 subluxation of C6 on C7. She had weakness in her left tricep and numbness in her left index finger. Pain was debilitating. Recommended ACDF with plating. Patient understood the risks, benefits, and alternatives and potential outcomes and wished to proceed.  PROCEDURE DETAILS: Patient was brought to the operating room placed under general endotracheal anesthesia. Patient was placed in the supine position on the operating room table. MRI of cervical spine showed no traumatic disc herniation or cord compression and therefore [place] tongs were applied and a used gentle manual traction to reduce the subluxation and the locked facets. I did this utilizing lateral fluoroscopy. Once the neck was reduced The neck was prepped with Duraprep and draped in a sterile fashion.   Three cc of local anesthesia was injected and a transverse incision was made on the right side of the neck.  Dissection was carried down thru the subcutaneous tissue and the platysma was  elevated, opened, and undermined with Metzenbaum  scissors.  Dissection was then carried out thru an avascular plane leaving the sternocleidomastoid carotid artery and jugular vein laterally and the trachea and esophagus medially. The ventral aspect of the vertebral column was identified and a localizing x-ray was taken. The C6-7 level was identified. The longus colli muscles were then elevated and the retractor was placed. The annulus was incised and the disc space entered. Discectomy was performed with micro-curettes and pituitary rongeurs. I then used the high-speed drill to drill the endplates down to the level of the posterior longitudinal ligament. The drill shavings were saved in a mucous trap for later arthrodesis. The operating microscope was draped and brought into the field provided additional magnification, illumination and visualization. Discectomy was continued posteriorly thru the disc space. Posterior longitudinal ligament was opened with a nerve hook, and then removed along with disc herniation and osteophytes, decompressing the spinal canal and thecal sac. We then continued to remove osteophytic overgrowth and disc material decompressing the neural foramina and exiting nerve roots bilaterally. The scope was angled up and down to help decompress and undercut the vertebral bodies. Once the decompression was completed we could pass a nerve hook circumferentially to assure adequate decompression in the midline and in the neural foramina. So by both visualization and palpation we felt we had an adequate decompression of the neural elements. We then measured the height of the intravertebral disc space and selected a 8 millimeter porous titanium interbody cage packed with autograft and morselized allograft. It was then gently positioned in the intravertebral disc space(s) and countersunk. I then used a 14 mm Alphatec plate and placed 14 mm fixed angle screws into the  vertebral bodies of each level and locked them into position. The wound was irrigated  with bacitracin solution, checked for hemostasis which was established and confirmed. Once meticulous hemostasis was achieved, we then proceeded with closure. The platysma was closed with interrupted 3-0 undyed Vicryl suture, the subcuticular layer was closed with interrupted 3-0 undyed Vicryl suture. The skin edges were approximated with steristrips. The drapes were removed. A sterile dressing was applied. The patient was then awakened from general anesthesia and transferred to the recovery room in stable condition. At the end of the procedure all sponge, needle and instrument counts were correct.   PLAN OF CARE: Admit to inpatient   PATIENT DISPOSITION:  PACU - hemodynamically stable.   Delay start of Pharmacological VTE agent (>24hrs) due to surgical blood loss or risk of bleeding:  yes

## 2017-12-24 NOTE — Anesthesia Preprocedure Evaluation (Signed)
Anesthesia Evaluation  Patient identified by MRN, date of birth, ID band Patient awake    Reviewed: Allergy & Precautions, NPO status , Patient's Chart, lab work & pertinent test results  Airway Mallampati: II   Neck ROM: Limited   Comment: Patient in C-collar Dental  (+) Teeth Intact, Dental Advisory Given   Pulmonary Current Smoker,    breath sounds clear to auscultation       Cardiovascular  Rhythm:Regular Rate:Normal     Neuro/Psych    GI/Hepatic   Endo/Other    Renal/GU      Musculoskeletal   Abdominal   Peds  Hematology   Anesthesia Other Findings   Reproductive/Obstetrics                             Anesthesia Physical Anesthesia Plan  ASA: III  Anesthesia Plan: General   Post-op Pain Management:    Induction: Intravenous  PONV Risk Score and Plan: 1 and Ondansetron and Dexamethasone  Airway Management Planned: Oral ETT  Additional Equipment:   Intra-op Plan:   Post-operative Plan: Extubation in OR  Informed Consent: I have reviewed the patients History and Physical, chart, labs and discussed the procedure including the risks, benefits and alternatives for the proposed anesthesia with the patient or authorized representative who has indicated his/her understanding and acceptance.   Dental advisory given  Plan Discussed with: CRNA and Anesthesiologist  Anesthesia Plan Comments:         Anesthesia Quick Evaluation

## 2017-12-24 NOTE — Progress Notes (Addendum)
Pharmacy Antibiotic Note  Crystal Richards is a 57 y.o. female admitted on 12/22/2017 for spinal surgery.  Pharmacy has been consulted for vancomycin dosing for post-op surgical prophylaxis.  Noted patient received vancomycin pre-op dose around 1330 today.  Patient does not have a drain.  SCr 0.6 from 2016, estimated CrCL 80-90 ml/min.  Plan: Vanc 1gm IV x 1 tomorrow at 0130 Pharmacy will sign off   Height: 5\' 7"  (170.2 cm) Weight: 115 lb (52.2 kg) IBW/kg (Calculated) : 61.6  Temp (24hrs), Avg:98.2 F (36.8 C), Min:97.9 F (36.6 C), Max:98.7 F (37.1 C)  No results for input(s): WBC, CREATININE, LATICACIDVEN, VANCOTROUGH, VANCOPEAK, VANCORANDOM, GENTTROUGH, GENTPEAK, GENTRANDOM, TOBRATROUGH, TOBRAPEAK, TOBRARND, AMIKACINPEAK, AMIKACINTROU, AMIKACIN in the last 168 hours.  CrCl cannot be calculated (Patient's most recent lab result is older than the maximum 21 days allowed.).    Allergies  Allergen Reactions  . Amoxicillin Diarrhea and Nausea And Vomiting  . Azithromycin Swelling    Facial Swelling   . Cephalexin Swelling  . Ciprofloxacin Rash     Crystal Richards D. Laney Potashang, PharmD, BCPS, BCCCP 12/24/2017, 4:38 PM

## 2017-12-24 NOTE — Anesthesia Procedure Notes (Signed)
Procedure Name: Intubation Date/Time: 12/24/2017 2:05 PM Performed by: Jed LimerickHarder, Kaine Mcquillen S, CRNA Pre-anesthesia Checklist: Patient identified, Emergency Drugs available, Suction available and Patient being monitored Patient Re-evaluated:Patient Re-evaluated prior to induction Oxygen Delivery Method: Circle System Utilized Preoxygenation: Pre-oxygenation with 100% oxygen Induction Type: IV induction Ventilation: Mask ventilation without difficulty Laryngoscope Size: Glidescope and 3 Grade View: Grade I Tube type: Oral Tube size: 7.0 mm Number of attempts: 1 Airway Equipment and Method: Rigid stylet and Video-laryngoscopy Placement Confirmation: ETT inserted through vocal cords under direct vision,  positive ETCO2 and breath sounds checked- equal and bilateral Secured at: 19 cm Tube secured with: Tape Dental Injury: Teeth and Oropharynx as per pre-operative assessment  Difficulty Due To: Difficult Airway- due to cervical collar and Difficult Airway-  due to neck instability Comments: Easy mask ventilation while in c-collar maintaining neutrality. Front of collar removed for glidescope intubation-neutrality held by Dr. Noreene LarssonJoslin during intubation.

## 2017-12-24 NOTE — Transfer of Care (Signed)
Immediate Anesthesia Transfer of Care Note  Patient: Crystal Richards  Procedure(s) Performed: C6-7 ANTERIOR CERVICAL DECOMPRESSION/DISCECTOMY FUSION 1 LEVEL (N/A )  Patient Location: PACU  Anesthesia Type:General  Level of Consciousness: awake, alert  and oriented  Airway & Oxygen Therapy: Patient Spontanous Breathing and Patient connected to nasal cannula oxygen  Post-op Assessment: Report given to RN and Post -op Vital signs reviewed and stable  Post vital signs: Reviewed and stable  Last Vitals:  Vitals Value Taken Time  BP 141/125 12/24/2017  3:27 PM  Temp 36.6 C 12/24/2017  3:28 PM  Pulse 100 12/24/2017  3:28 PM  Resp 16 12/24/2017  3:28 PM  SpO2 96 % 12/24/2017  3:28 PM  Vitals shown include unvalidated device data.  Last Pain:  Vitals:   12/24/17 1152  TempSrc: Oral  PainSc:       Patients Stated Pain Goal: 0 (12/23/17 1638)  Complications: No apparent anesthesia complications

## 2017-12-24 NOTE — Anesthesia Postprocedure Evaluation (Signed)
Anesthesia Post Note  Patient: Crystal Richards  Procedure(s) Performed: CERVICAL SIX-SEVEN  ANTERIOR CERVICAL DECOMPRESSION/DISCECTOMY FUSION (N/A Spine Cervical)     Patient location during evaluation: PACU Anesthesia Type: General Level of consciousness: awake and alert Pain management: pain level controlled Vital Signs Assessment: post-procedure vital signs reviewed and stable Respiratory status: spontaneous breathing, nonlabored ventilation and respiratory function stable Cardiovascular status: blood pressure returned to baseline and stable Postop Assessment: no apparent nausea or vomiting Anesthetic complications: no    Last Vitals:  Vitals:   12/24/17 1555 12/24/17 1616  BP: (!) 155/82 (!) 119/99  Pulse: 90 92  Resp: 19 17  Temp:  37.1 C  SpO2: 100% 96%    Last Pain:  Vitals:   12/24/17 1616  TempSrc: Oral  PainSc:                  Beryle Lathehomas E Brock

## 2017-12-25 MED ORDER — METHOCARBAMOL 500 MG PO TABS: 500.0000 mg | ORAL_TABLET | Freq: Four times a day (QID) | ORAL | 0 refills | Status: DC | PRN

## 2017-12-25 MED ORDER — HYDROCODONE-ACETAMINOPHEN 5-325 MG PO TABS: 1.0000 | ORAL_TABLET | ORAL | 0 refills | Status: AC | PRN

## 2017-12-25 NOTE — Plan of Care (Signed)

## 2017-12-25 NOTE — Discharge Summary (Signed)
Physician Discharge Summary  Patient ID: Crystal Richards MRN: [id number] DOB/AGE: November 10, 1960 57 y.o.  Admit date: 12/22/2017 Discharge date: 12/25/2017  Admission Diagnoses: Cervical spine fracture C6-7 with jumped facet, neck and left arm pain with triceps weakness  Discharge Diagnoses: same  Discharged Condition: good  Hospital Course: The patient was admitted on 12/22/2017 and taken to the operating room where the patient underwent acdf C6-7. The patient tolerated the procedure well and was taken to the recovery room and then to the floor in stable condition. The hospital course was routine. There were no complications. The wound remained clean dry and intact. Pt had appropriate neck soreness. No complaints of arm pain or new N/T/W. The patient remained afebrile with stable vital signs, and tolerated a regular diet. The patient continued to increase activities, and pain was well controlled with oral pain medications.   Consults: None  Significant Diagnostic Studies:  Results for orders placed or performed during the hospital encounter of 12/21/14  Urine culture  Result Value Ref Range   Specimen Description URINE, CLEAN CATCH    Special Requests NONE    Culture      NO GROWTH 1 DAY Performed at [facility]    Report Status 12/23/2014 FINAL   Urinalysis, Routine w reflex microscopic (not at [facility])  Result Value Ref Range   Color, Urine YELLOW YELLOW   APPearance CLEAR CLEAR   Specific Gravity, Urine 1.025 1.005 - 1.030   pH 5.5 5.0 - 8.0   Glucose, UA NEGATIVE NEGATIVE mg/dL   Hgb urine dipstick NEGATIVE NEGATIVE   Bilirubin Urine SMALL (A) NEGATIVE   Ketones, ur 15 (A) NEGATIVE mg/dL   Protein, ur NEGATIVE NEGATIVE mg/dL   Nitrite NEGATIVE NEGATIVE   Leukocytes, UA NEGATIVE NEGATIVE  I-Stat Chem 8, ED  Result Value Ref Range   Sodium 133 (L) 135 - 145 mmol/L   Potassium 3.9 3.5 - 5.1 mmol/L   Chloride 94 (L) 101 - 111 mmol/L   BUN 10 6 - 20 mg/dL   Creatinine,  Ser 7.82 0.44 - 1.00 mg/dL   Glucose, Bld 956 (H) 65 - 99 mg/dL   Calcium, Ion 2.13 0.86 - 1.23 mmol/L   TCO2 23 0 - 100 mmol/L   Hemoglobin 12.2 12.0 - 15.0 g/dL   HCT 57.8 46.9 - 62.9 %    Dg Cervical Spine 1 View  Result Date: 12/24/2017 CLINICAL DATA:  C6-7 ACDF. EXAM: DG C-ARM 61-120 MIN; DG CERVICAL SPINE - 1 VIEW COMPARISON:  MRI of cervical spine 12/23/2017 FINDINGS: Single intraoperative fluoro spot images submitted. Patient is status post ACDF at C6-7. AP alignment is now anatomic. Metallic disc spacer is in place. The patient is intubated. IMPRESSION: C6-7 ACDF without radiographic evidence for complication. Electronically Signed   By: Marin Roberts M.D.   On: 12/24/2017 15:31   Dg Scapula Left  Result Date: 12/17/2017 CLINICAL DATA:  Left shoulder pain after fall out of bed this morning. EXAM: LEFT SCAPULA - 2+ VIEWS; LEFT SHOULDER - 2+ VIEW COMPARISON:  None. FINDINGS: Two views of the left scapula and three views of the left shoulder are obtained. The left shoulder and left scapula appear intact. Coracoclavicular and acromioclavicular spaces are maintained. No evidence of acute fracture or subluxation. No focal bone lesion or bone destruction. Bone cortex and trabecular architecture appear intact. No radiopaque soft tissue foreign bodies. IMPRESSION: No acute bony abnormalities demonstrated in the left shoulder or left scapula. Electronically Signed   By: Marisa Cyphers.D.  On: 12/17/2017 04:41   Ct Cervical Spine Wo Contrast  Result Date: 12/22/2017 CLINICAL DATA:  Right upper arm pain after fall last week. EXAM: CT CERVICAL SPINE WITHOUT CONTRAST TECHNIQUE: Multidetector CT imaging of the cervical spine was performed without intravenous contrast. Multiplanar CT image reconstructions were also generated. COMPARISON:  None. FINDINGS: Alignment: Grade 2 anterolisthesis of C6-7 is noted secondary to moderately displaced fracture involving the left posterior lamina of C6  and moderately displaced fracture involving the left-sided superior articular process of C7 and left-sided inferior articular process of C6. Skull base and vertebrae: Also noted is mildly displaced fracture involving the superior articular process of C7. Minimally displaced fracture involving right-sided posterior lamina of C6 is noted. Mildly displaced fracture is seen involving the left-sided transverse process of C7. Minimally displaced fracture is seen involving the right-sided transverse process of C7. Soft tissues and spinal canal: No prevertebral fluid or swelling. No visible canal hematoma. Disc levels:  Mild degenerative disc disease is noted at C6-7. Upper chest: Negative. Other: Mild degenerative changes seen involving the left-sided posterior facet joint of C7-T1. IMPRESSION: Grade 2 anterolisthesis of C6-7 is noted secondary to moderately displaced fracture involving the left posterior lamina of C6 with associated moderately displaced fractures involving the left-sided superior articular process C7 and inferior articular processes C6. Also noted is minimally displaced fracture involving right-sided posterior lamina of C6 with mildly displaced fracture involving superior articular process of C7. Minimally to mildly displaced fractures are seen involving bilateral transverse processes of C7. Critical Value/emergent results were called by telephone at the time of interpretation on 12/22/2017 at 3:28 pm to Dr. Eustace PenZickowski, who verbally acknowledged these results. Electronically Signed   By: Lupita RaiderJames  Green Jr, M.D.   On: 12/22/2017 15:29   Mr Cervical Spine Wo Contrast  Result Date: 12/23/2017 CLINICAL DATA:  Cervical spine fracture. Fall 5 days ago. Left arm pain. EXAM: MRI CERVICAL SPINE WITHOUT CONTRAST TECHNIQUE: Multiplanar, multisequence MR imaging of the cervical spine was performed. No intravenous contrast was administered. COMPARISON:  CT cervical spine 12/22/2017 FINDINGS: Alignment: 5 mm  anterolisthesis C6-7 related to fractures. Remaining alignment normal. Image quality degraded by motion. Vertebrae: Fractures are better seen on recent CT. In summary, there are bilateral laminar fractures of C6. Displaced facet fracture on the left at C6-7. Superior articulating facet of C7 projects into the foramen with severe left foraminal stenosis. Possible small extradural hematoma on the left at C6-7. Bilateral C7 transverse process fractures. Fracture of the inferior articulating facet of C6 on the left. Cord: Negative for cord compression. Limited cord evaluation due to motion. No cord edema identified. Posterior Fossa, vertebral arteries, paraspinal tissues: Negative Disc levels: C2-3: Negative C3-4: Negative C4-5: Negative C5-6: Mild disc degeneration. Mild left facet degeneration without stenosis C6-7: Fracture dislocation as above with severe left foraminal encroachment due to bony fragment possible extradural hemorrhage. Cord flattening and mild spinal stenosis. C7-T1: Bilateral facet degeneration.  Negative for stenosis. IMPRESSION: 1. Image quality degraded by motion. Limited cord evaluation. No evidence of cord edema. The patient has a relatively large spinal canal 2. Fracture dislocation at C6-7 on the left. Bone fragment extending in the left foramen with marked left foraminal encroachment and possible small extradural hemorrhage in the foramen. Mild cord flattening and mild spinal stenosis. 3. Mild degenerative change elsewhere without additional cervical spine stenosis. Electronically Signed   By: Marlan Palauharles  Clark M.D.   On: 12/23/2017 15:34   Dg Shoulder Left  Result Date: 12/17/2017 CLINICAL DATA:  Left  shoulder pain after fall out of bed this morning. EXAM: LEFT SCAPULA - 2+ VIEWS; LEFT SHOULDER - 2+ VIEW COMPARISON:  None. FINDINGS: Two views of the left scapula and three views of the left shoulder are obtained. The left shoulder and left scapula appear intact. Coracoclavicular and  acromioclavicular spaces are maintained. No evidence of acute fracture or subluxation. No focal bone lesion or bone destruction. Bone cortex and trabecular architecture appear intact. No radiopaque soft tissue foreign bodies. IMPRESSION: No acute bony abnormalities demonstrated in the left shoulder or left scapula. Electronically Signed   By: Burman Nieves M.D.   On: 12/17/2017 04:41   Dg C-arm 1-60 Min  Result Date: 12/24/2017 CLINICAL DATA:  C6-7 ACDF. EXAM: DG C-ARM 61-120 MIN; DG CERVICAL SPINE - 1 VIEW COMPARISON:  MRI of cervical spine 12/23/2017 FINDINGS: Single intraoperative fluoro spot images submitted. Patient is status post ACDF at C6-7. AP alignment is now anatomic. Metallic disc spacer is in place. The patient is intubated. IMPRESSION: C6-7 ACDF without radiographic evidence for complication. Electronically Signed   By: Marin Roberts M.D.   On: 12/24/2017 15:31    Antibiotics:  Anti-infectives (From admission, onward)   Start     Dose/Rate Route Frequency Ordered Stop   12/25/17 0600  vancomycin (VANCOCIN) IVPB 1000 mg/200 mL premix     1,000 mg 200 mL/hr over 60 Minutes Intravenous On call to O.R. 12/24/17 1318 12/24/17 1430   12/25/17 0130  vancomycin (VANCOCIN) IVPB 1000 mg/200 mL premix     1,000 mg 200 mL/hr over 60 Minutes Intravenous  Once 12/24/17 1636 12/25/17 0531   12/24/17 1505  bacitracin 50,000 Units in sodium chloride 0.9 % 500 mL irrigation  Status:  Discontinued       As needed 12/24/17 1505 12/24/17 1530   12/24/17 1319  vancomycin (VANCOCIN) 1-5 GM/200ML-% IVPB    Note to Pharmacy:  Dia Crawford   : cabinet override      12/24/17 1319 12/24/17 1330      Discharge Exam: Blood pressure (!) 127/58, pulse 63, temperature 99.8 F (37.7 C), temperature source Oral, resp. rate 20, height 5\' 7"  (1.702 m), weight 52.2 kg, last menstrual period 08/28/2010, SpO2 96 %. Neurologic: Grossly normal Ambulating and voiding well, incision cdi  Discharge  Medications:   Allergies as of 12/25/2017      Reactions   Amoxicillin Diarrhea, Nausea And Vomiting   Azithromycin Swelling   Facial Swelling    Cephalexin Swelling   Ciprofloxacin Rash      Medication List    STOP taking these medications   ibuprofen 200 MG tablet Commonly known as:  ADVIL,MOTRIN     TAKE these medications   cetirizine 10 MG tablet Commonly known as:  ZYRTEC Take 1 tablet (10 mg total) by mouth daily.   HYDROcodone-acetaminophen 5-325 MG tablet Commonly known as:  NORCO/VICODIN Take 1 tablet by mouth every 4 (four) hours as needed for moderate pain.   methocarbamol 500 MG tablet Commonly known as:  ROBAXIN Take 1 tablet (500 mg total) by mouth every 6 (six) hours as needed for muscle spasms.   naproxen 500 MG tablet Commonly known as:  NAPROSYN Take 500 mg by mouth daily as needed for mild pain.       Disposition: home   Final Dx: acdf C6-7  Discharge Instructions     Remove dressing in 72 hours   Complete by:  As directed    Call MD for:  difficulty breathing, headache or visual  disturbances   Complete by:  As directed    Call MD for:  hives   Complete by:  As directed    Call MD for:  persistant dizziness or light-headedness   Complete by:  As directed    Call MD for:  persistant nausea and vomiting   Complete by:  As directed    Call MD for:  redness, tenderness, or signs of infection (pain, swelling, redness, odor or green/yellow discharge around incision site)   Complete by:  As directed    Call MD for:  severe uncontrolled pain   Complete by:  As directed    Call MD for:  temperature >100.4   Complete by:  As directed    Diet - low sodium heart healthy   Complete by:  As directed    Driving Restrictions   Complete by:  As directed    No driving for 2 weeks, no riding in the car for 1 week   Increase activity slowly   Complete by:  As directed    Lifting restrictions   Complete by:  As directed    No lifting more than 8 lbs       Follow-up Information    Tia Alert, MD. Schedule an appointment as soon as possible for a visit in 2 week(s).   Specialty:  Neurosurgery Contact information: [address] [place] [address] [phone]            Signed: Tiana Loft [facility] 12/25/2017, 9:27 AM

## 2017-12-26 ENCOUNTER — Encounter (HOSPITAL_COMMUNITY): Payer: Self-pay | Admitting: Neurological Surgery

## 2019-01-25 ENCOUNTER — Ambulatory Visit
Admission: EM | Admit: 2019-01-25 | Discharge: 2019-01-25 | Disposition: A | Discharge status: Home / Self Care | Attending: Emergency Medicine | Admitting: Emergency Medicine

## 2019-01-25 ENCOUNTER — Other Ambulatory Visit: Payer: Self-pay

## 2019-01-25 DIAGNOSIS — L239 Allergic contact dermatitis, unspecified cause: Secondary | ICD-10-CM | POA: Diagnosis not present

## 2019-01-25 MED ORDER — CETIRIZINE HCL 10 MG PO TABS: 10.0000 mg | ORAL_TABLET | Freq: Every day | ORAL | 0 refills | Status: AC

## 2019-01-25 MED ORDER — TRIAMCINOLONE ACETONIDE 0.1 % EX CREA: 1.0000 "application " | TOPICAL_CREAM | Freq: Two times a day (BID) | CUTANEOUS | 0 refills | Status: AC

## 2019-01-25 NOTE — ED Provider Notes (Signed)
[facility]    CSN: [id number] Arrival date & time: 01/25/19  1806      History   Chief Complaint Chief Complaint  Patient presents with  . Rash    HPI Crystal Richards is a 58 y.o. female.   Crystal Richards 58 year old female to the urgent care with a complaint of rash for the past 1 year.  She denies changes in soaps, detergents, or anyone with similar symptoms.  She localizes the rash to her hand and upper arm.  She describes it as  Itchy, red and spreading.  She has tried hydrocortisone and  cetirizine with relief.  Denies nausea, vomiting, abdominal pain, chest pain, chest tightness.   Rash   Past Medical History:  Diagnosis Date  . High risk HPV infection   . 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 S/P cervical spinal fusion 12/24/2017  . C6 cervical fracture ([place]) 12/22/2017  . History of cervical fracture 12/22/2017    Past Surgical History:  Procedure Laterality Date  . ANTERIOR CERVICAL DECOMP/DISCECTOMY FUSION N/A 12/24/2017   Procedure: CERVICAL SIX-SEVEN  ANTERIOR CERVICAL DECOMPRESSION/DISCECTOMY FUSION;  Surgeon: Eustace Moore, MD;  Location: [place];  Service: Neurosurgery;  Laterality: N/A;  . CESAREAN SECTION    . COLPOSCOPY  02/2010   + ECC LGSIL fragment  . TONSILLECTOMY AND ADENOIDECTOMY    . TUBAL LIGATION      OB History    Gravida  3   Para  3   Term      Preterm      AB      Living        SAB      TAB      Ectopic      Multiple      Live Births               Home Medications    Prior to Admission medications   Medication Sig Start Date End Date Taking? Authorizing Provider  cetirizine (ZYRTEC ALLERGY) 10 MG tablet Take 1 tablet (10 mg total) by mouth daily. 01/25/19   Jayleah Garbers, Darrelyn Hillock, FNP  methocarbamol (ROBAXIN) 500 MG tablet Take 1 tablet (500 mg total) by mouth every 6 (six) hours as needed for muscle spasms. 12/25/17    Meyran, Ocie Cornfield, NP  naproxen (NAPROSYN) 500 MG tablet Take 500 mg by mouth daily as needed for mild pain.    [provider]  triamcinolone cream (KENALOG) 0.1 % Apply 1 application topically 2 (two) times daily. 01/25/19   Emerson Monte, FNP    Family History Family History  Problem Relation Age of Onset  . Diabetes Mother   . Hypertension Mother   . Healthy Father     Social History Social History   Tobacco Use  . Smoking status: Current Every Day Smoker    Packs/day: 1.00  . Smokeless tobacco: Never Used  Substance Use Topics  . Alcohol use: Yes    Comment: occassionally  . Drug use: No     Allergies   Amoxicillin, Azithromycin, Cephalexin, and Ciprofloxacin   Review of Systems Review of Systems  Constitutional: Negative.   Respiratory: Negative.   Cardiovascular: Negative.   Skin: Positive for rash.  ROS: All other are negatives   Physical Exam Triage Vital Signs ED Triage Vitals  Enc Vitals Group     BP 01/25/19 1903 (!) 154/95  Pulse Rate 01/25/19 1903 95     Resp 01/25/19 1903 18     Temp 01/25/19 1903 98.5 F (36.9 C)     Temp Source 01/25/19 1903 Oral     SpO2 01/25/19 1903 97 %     Weight --      Height --      Head Circumference --      Peak Flow --      Pain Score 01/25/19 1905 0     Pain Loc --      Pain Edu? --      Excl. in GC? --    No data found.  Updated Vital Signs BP (!) 154/95 (BP Location: Right Arm)   Pulse 95   Temp 98.5 F (36.9 C) (Oral)   Resp 18   LMP 08/28/2010   SpO2 97%   Visual Acuity Right Eye Distance:   Left Eye Distance:   Bilateral Distance:    Right Eye Near:   Left Eye Near:    Bilateral Near:     Physical Exam Constitutional:      General: She is not in acute distress.    Appearance: Normal appearance. She is not ill-appearing or toxic-appearing.  Cardiovascular:     Rate and Rhythm: Normal rate and regular rhythm.     Pulses: Normal pulses.     Heart sounds: Normal  heart sounds.  Pulmonary:     Effort: Pulmonary effort is normal. No respiratory distress.     Breath sounds: Normal breath sounds. No wheezing.  Chest:     Chest wall: No tenderness.  Skin:    Findings: Rash present.     Comments: Dry scaly rash on upper arm and ventral part of bilateral hand  Neurological:     Mental Status: She is alert.      UC Treatments / Results  Labs (all labs ordered are listed, but only abnormal results are displayed) Labs Reviewed - No data to display  EKG   Radiology No results found.  Procedures Procedures (including critical care time)  Medications Ordered in UC Medications - No data to display  Initial Impression / Assessment and Plan / UC Course  I have reviewed the triage vital signs and the nursing notes.  Pertinent labs & imaging results that were available during my care of the patient were reviewed by me and considered in my medical decision making (see chart for details).   Patient stable for discharge.  Benign physical exam.  Will prescribe triamcinolone, and Zyrtec.  Advised patient to take medication as prescribed.  To follow-up with primary care for symptom worsening.  Patient verbalized an understanding of the plan of care.   Final Clinical Impressions(s) / UC Diagnoses   Final diagnoses:  Allergic contact dermatitis, unspecified trigger     Discharge Instructions     Prescribed zyrtec and triamcinolone cream Take as prescribed and to completion Limit hot shower and baths, or bathe with warm water.   Moisturize skin daily Follow up with PCP if symptoms persists Return or go to the ER if you have any new or worsening symptoms     ED Prescriptions    Medication Sig Dispense Auth. Provider   cetirizine (ZYRTEC ALLERGY) 10 MG tablet Take 1 tablet (10 mg total) by mouth daily. 30 tablet Keidrick Murty, Zachery Dakins, FNP   triamcinolone cream (KENALOG) 0.1 % Apply 1 application topically 2 (two) times daily. 30 g Durward Parcel, FNP     PDMP  not reviewed this encounter.   Durward Parcelvegno, Lisvet Rasheed S, FNP 01/25/19 1923

## 2019-01-25 NOTE — ED Triage Notes (Signed)
Pt presents to UC w/ c/o itchy rash on hands and forearms x1 year after having spinal fusion. Pt states she went to other UC in [place] 6 months ago which prescribed her prednisone, ointment, and cetirizine which helped for a few months. Pt states it started to slowly come back.

## 2019-01-25 NOTE — Discharge Instructions (Addendum)
Prescribed zyrtec and triamcinolone cream Take as prescribed and to completion Limit hot shower and baths, or bathe with warm water.   Moisturize skin daily Follow up with PCP if symptoms persists Return or go to the ER if you have any new or worsening symptoms 

## 2019-02-06 ENCOUNTER — Other Ambulatory Visit: Payer: Self-pay

## 2019-02-06 ENCOUNTER — Ambulatory Visit
Admission: EM | Admit: 2019-02-06 | Discharge: 2019-02-06 | Disposition: A | Discharge status: Home / Self Care | Attending: Emergency Medicine | Admitting: Emergency Medicine

## 2019-02-06 DIAGNOSIS — L2089 Other atopic dermatitis: Secondary | ICD-10-CM

## 2019-02-06 DIAGNOSIS — R220 Localized swelling, mass and lump, head: Secondary | ICD-10-CM | POA: Diagnosis not present

## 2019-02-06 DIAGNOSIS — R030 Elevated blood-pressure reading, without diagnosis of hypertension: Secondary | ICD-10-CM | POA: Diagnosis not present

## 2019-02-06 MED ORDER — DEXAMETHASONE SODIUM PHOSPHATE 10 MG/ML IJ SOLN
10.0000 mg | Freq: Once | INTRAMUSCULAR | Status: AC
  Administered 2019-02-06: 10 mg via INTRAMUSCULAR

## 2019-02-06 MED ORDER — PREDNISONE 10 MG (21) PO TBPK: ORAL_TABLET | Freq: Every day | ORAL | 0 refills | Status: DC

## 2019-02-06 NOTE — ED Triage Notes (Signed)
Pt presents to UC w/ c/o itchy rash on face x3 days. Redness and puffiness mainly around eyes. Pt denies any new medications or trying new foods.

## 2019-02-06 NOTE — Discharge Instructions (Signed)
Steroid shot given in office Apply cold compresses as needed Prednisone taper prescribed.  Take as directed and to completion Follow up with PCP for further evaluation and management.  May benefit from allergy or dermatology referral Return or go to the ER if you have any new or worsening symptoms such as fever, chills, nausea, vomiting, redness, swelling, discharge, if symptoms do not improve with medications, etc..Marland Kitchen

## 2019-02-06 NOTE — ED Provider Notes (Signed)
[facility]   [id number] 02/06/19 Arrival Time: 1317  CC: Rash   SUBJECTIVE:  Crystal Richards is a 59 y.o. female who presents with a rash x 3 days.  Denies precipitating event or trauma.  Denies changes in soaps, detergents, close contacts with similar rash, environmental trigger, or allergy. Denies medications change or starting a new medication recently.  Localizes the rash to around eyes.  Describes it as itchy, red, and swelling.  Has tried OTC zyrtec without relief.  Denies aggravating factors.  Reports similar symptoms in the past that improved with prednisone.   Denies fever, chills, nausea, vomiting, discharge, dyspnea, dysphagia, oral manifestation such as lip/tongue/mouth swelling/ tingling/ tightness/ numbness, SOB, chest pain, abdominal pain, changes in bowel or bladder function.    ROS: As per HPI.  All other pertinent ROS negative.     Past Medical History:  Diagnosis Date  . High risk HPV infection   . 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 ANTERIOR CERVICAL DECOMP/DISCECTOMY FUSION N/A 12/24/2017   Procedure: CERVICAL SIX-SEVEN  ANTERIOR CERVICAL DECOMPRESSION/DISCECTOMY FUSION;  Surgeon: Tia Alert, MD;  Location: [facility] OR;  Service: Neurosurgery;  Laterality: N/A;  . CESAREAN SECTION    . COLPOSCOPY  02/2010   + ECC LGSIL fragment  . TONSILLECTOMY AND ADENOIDECTOMY    . TUBAL LIGATION     Allergies  Allergen Reactions  . Amoxicillin Diarrhea and Nausea And Vomiting  . Azithromycin Swelling    Facial Swelling   . Cephalexin Swelling  . Ciprofloxacin Rash   No current facility-administered medications on file prior to encounter.   Current Outpatient Medications on File Prior to Encounter  Medication Sig Dispense Refill  . cetirizine (ZYRTEC ALLERGY) 10 MG tablet Take 1 tablet (10 mg total) by mouth daily. 30 tablet 0  . methocarbamol (ROBAXIN) 500 MG tablet Take 1 tablet (500  mg total) by mouth every 6 (six) hours as needed for muscle spasms. 30 tablet 0  . naproxen (NAPROSYN) 500 MG tablet Take 500 mg by mouth daily as needed for mild pain.    Marland Kitchen triamcinolone cream (KENALOG) 0.1 % Apply 1 application topically 2 (two) times daily. 30 g 0   Social History   Socioeconomic History  . Marital status: Divorced    Spouse name: Not on file  . Number of children: Not on file  . Years of education: Not on file  . Highest education level: Not on file  Occupational History  . Not on file  Tobacco Use  . Smoking status: Current Every Day Smoker    Packs/day: 1.00  . Smokeless tobacco: Never Used  Substance and Sexual Activity  . Alcohol use: Yes    Comment: occassionally  . Drug use: No  . Sexual activity: Never    Birth control/protection: Surgical  Other Topics Concern  . Not on file  Social History Narrative  . Not on file   Social Determinants of Health   Financial Resource Strain:   . Difficulty of Paying Living Expenses: Not on file  Food Insecurity:   . Worried About Programme researcher, broadcasting/film/video in the Last Year: Not on file  . Ran Out of Food in the Last Year: Not on file  Transportation Needs:   . Lack of Transportation (Medical): Not on file  . Lack of Transportation (Non-Medical): Not on file  Physical Activity:   . Days of Exercise per Week: Not on file  .  Minutes of Exercise per Session: Not on file  Stress:   . Feeling of Stress : Not on file  Social Connections:   . Frequency of Communication with Friends and Family: Not on file  . Frequency of Social Gatherings with Friends and Family: Not on file  . Attends Religious Services: Not on file  . Active Member of Clubs or Organizations: Not on file  . Attends Archivist Meetings: Not on file  . Marital Status: Not on file  Intimate Partner Violence:   . Fear of Current or Ex-Partner: Not on file  . Emotionally Abused: Not on file  . Physically Abused: Not on file  . Sexually  Abused: Not on file   Family History  Problem Relation Age of Onset  . Diabetes Mother   . Hypertension Mother   . Healthy Father     OBJECTIVE: Vitals:   02/06/19 1400  BP: (!) 177/92  Pulse: 77  Resp: 16  Temp: 98.4 F (36.9 C)  TempSrc: Oral  SpO2: 96%    General appearance: alert; no distress; tolerating own secretions; speaking in full sentences Head: NCAT ENT: PERRL, EOMI grossly; nares patent; oropharynx clear Lungs: clear to auscultation bilaterally Heart: regular rate and rhythm.   Extremities: no edema Skin: warm and dry; erythema and swelling localized to periorbital region of bilateral eyes with overlying dry skin; diffuse erythematous macular rash on lower face, dorsal aspects of hands, and flexor regions of elbows Psychological: alert and cooperative; normal mood and affect  ASSESSMENT & PLAN:  1. Other atopic dermatitis   2. Swelling around both eyes     Meds ordered this encounter  Medications  . predniSONE (STERAPRED UNI-PAK 21 TAB) 10 MG (21) TBPK tablet    Sig: Take by mouth daily. Take 6 tabs by mouth daily  for 2 days, then 5 tabs for 2 days, then 4 tabs for 2 days, then 3 tabs for 2 days, 2 tabs for 2 days, then 1 tab by mouth daily for 2 days    Dispense:  42 tablet    Refill:  0    Order Specific Question:   Supervising Provider    Answer:   Raylene Everts [[id number]]  . dexamethasone (DECADRON) injection 10 mg   Steroid shot given in office Apply cold compresses as needed Prednisone taper prescribed.  Take as directed and to completion Follow up with PCP for further evaluation and management.  May benefit from allergy or dermatology referral Return or go to the ER if you have any new or worsening symptoms such as fever, chills, nausea, vomiting, redness, swelling, discharge, if symptoms do not improve with medications, etc...  Reviewed expectations re: course of current medical issues. Questions answered. Outlined signs and symptoms  indicating need for more acute intervention. Patient verbalized understanding. After Visit Summary given.   Lestine Box, PA-C 02/06/19 1704

## 2019-04-22 ENCOUNTER — Ambulatory Visit: Attending: Internal Medicine

## 2019-04-22 DIAGNOSIS — Z23 Encounter for immunization: Secondary | ICD-10-CM

## 2019-04-22 NOTE — Progress Notes (Signed)
   Covid-19 Vaccination Clinic  Name:  Crystal Richards    MRN: [id number] DOB: November 24, 1960  04/22/2019  [name] was observed post Covid-19 immunization for 15 minutes without incident. She was provided with Vaccine Information Sheet and instruction to access the V-Safe system.   [name] was instructed to call 911 with any severe reactions post vaccine: Marland Kitchen Difficulty breathing  . Swelling of face and throat  . A fast heartbeat  . A bad rash all over body  . Dizziness and weakness   Immunizations Administered    Name Date Dose VIS Date Route   Moderna COVID-19 Vaccine 04/22/2019 12:05 PM 0.5 mL 12/29/2018 Intramuscular   Manufacturer: Moderna   Lot: [id number]   NDC: 48016-553-74

## 2019-05-25 ENCOUNTER — Ambulatory Visit: Attending: Internal Medicine

## 2019-05-25 DIAGNOSIS — Z23 Encounter for immunization: Secondary | ICD-10-CM

## 2019-05-25 NOTE — Progress Notes (Signed)
   Covid-19 Vaccination Clinic  Name:  Crystal Richards    MRN: [id number] DOB: [birth year]  05/25/2019  Crystal Richards was observed post Covid-19 immunization for 15 minutes without incident. She was provided with Vaccine Information Sheet and instruction to access the V-Safe system.   Crystal Richards was instructed to call 911 with any severe reactions post vaccine: Marland Kitchen Difficulty breathing  . Swelling of face and throat  . A fast heartbeat  . A bad rash all over body  . Dizziness and weakness   Immunizations Administered    Name Date Dose VIS Date Route   Moderna COVID-19 Vaccine 05/25/2019 12:37 PM 0.5 mL 12/2018 Intramuscular   Manufacturer: Moderna   Lot: [id number]   NDC: 58099-833-82

## 2019-07-20 IMAGING — MR MR CERVICAL SPINE W/O CM
4 of 6 series · 19 of 48 positions shown · non-contrast
Comparison: CT cervical spine 12/22/2017

CLINICAL DATA: Cervical spine fracture. Fall 5 days ago. Left arm
pain.

EXAM:
MRI CERVICAL SPINE WITHOUT CONTRAST
TECHNIQUE: Multiplanar, multisequence MR imaging of the cervical spine was
performed. No intravenous contrast was administered.

[Series 2: T2 · sagittal · 3.0mm · 0.43mm/px · 5 of 15 slices shown (1 of 2)]
[im 1/15]
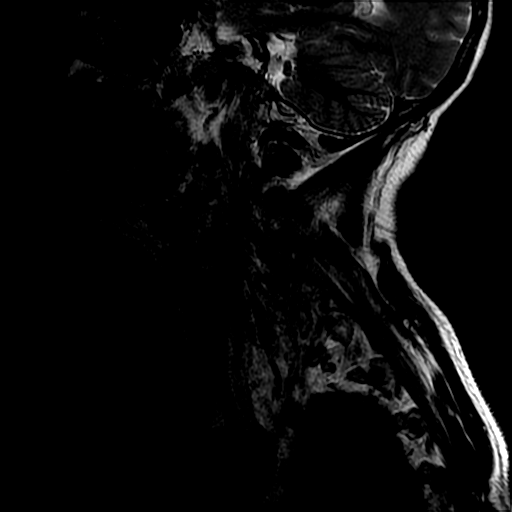
[im 4/15]
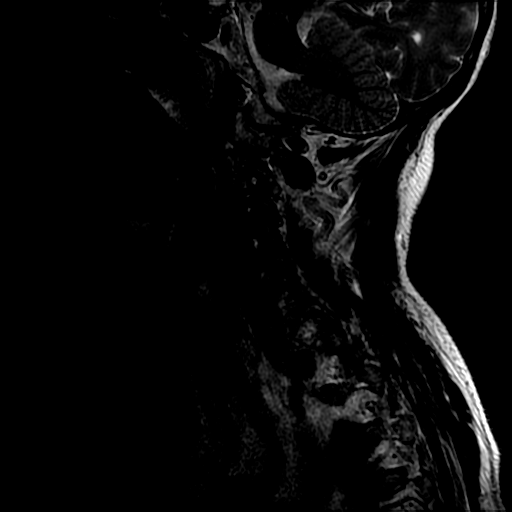
[im 8/15]
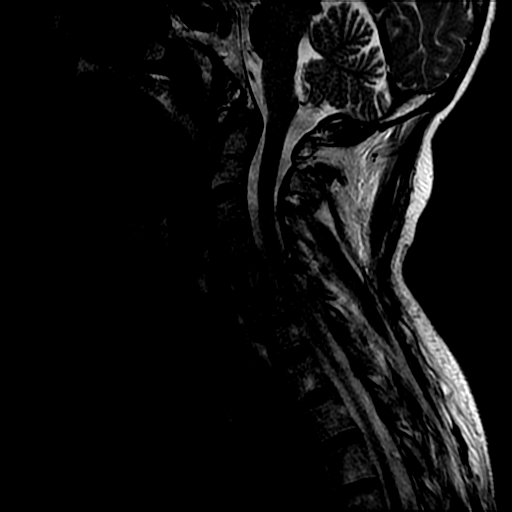
[im 11/15]
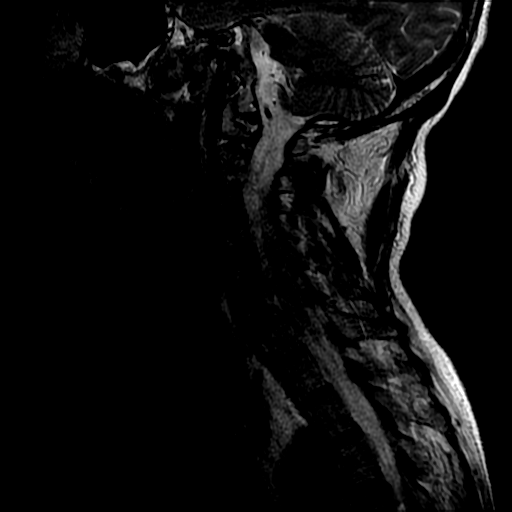
[im 15/15]
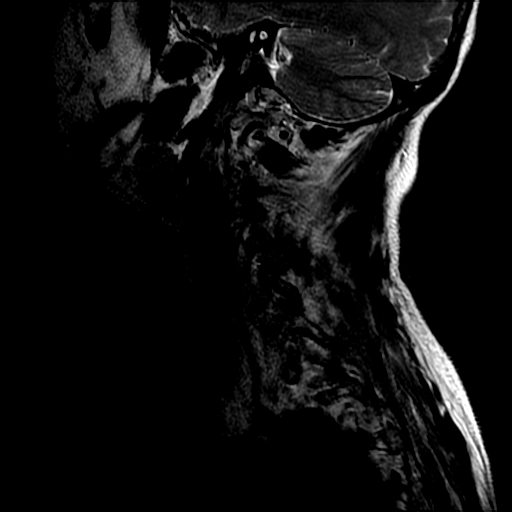

[Series 3: T1 · sagittal · 3.0mm · 0.43mm/px · 3 of 15 slices shown]
[im 1/15]
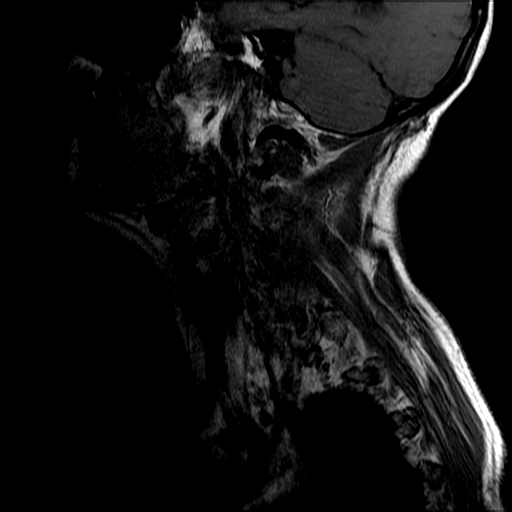
[im 8/15]
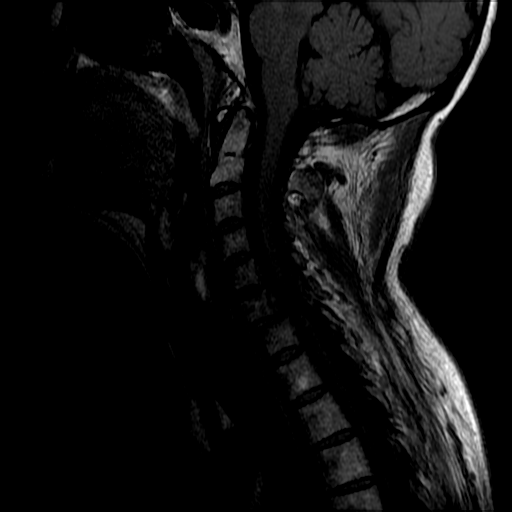
[im 15/15]
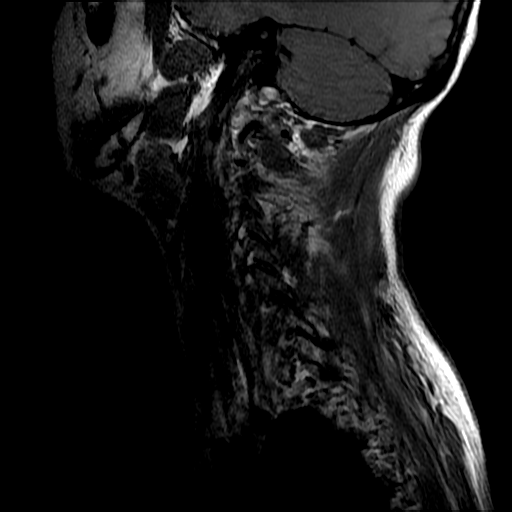

[Series 4: sag ir · sagittal · 3.0mm · 0.43mm/px · 3 of 15 slices shown]
[im 3/15]
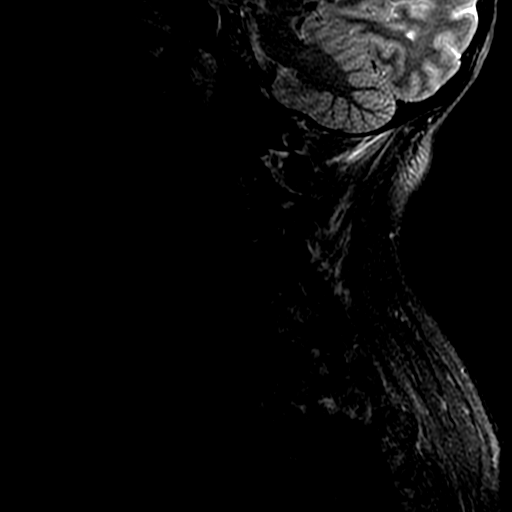
[im 9/15]
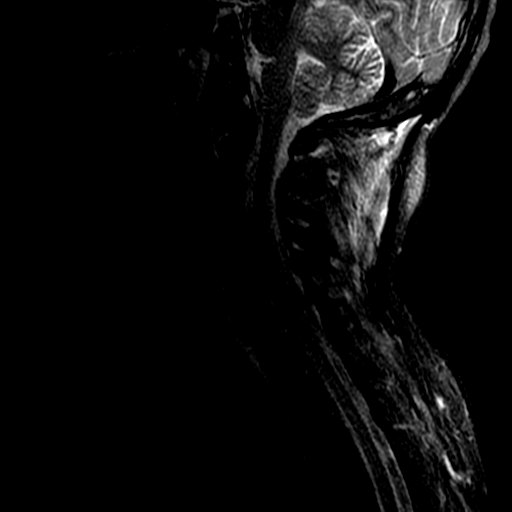
[im 15/15]
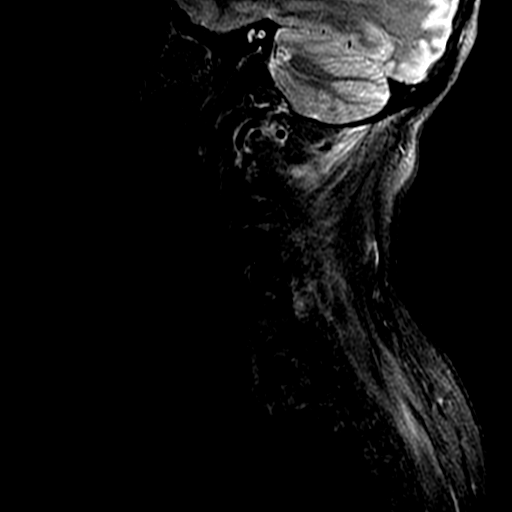

[Series 6: T2 · axial · 3.0mm · 0.39mm/px · z∈[-103,-24]mm · 8 of 33 slices shown (2 of 2)]
[im 1/33]
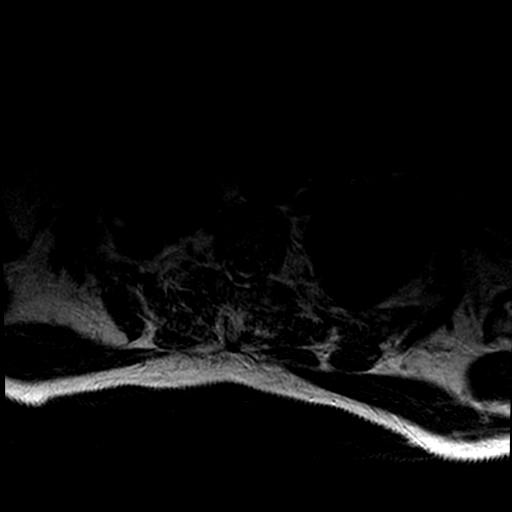
[im 6/33]
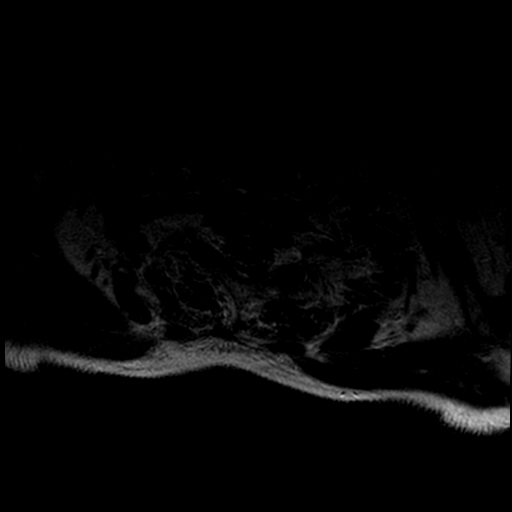
[im 11/33]
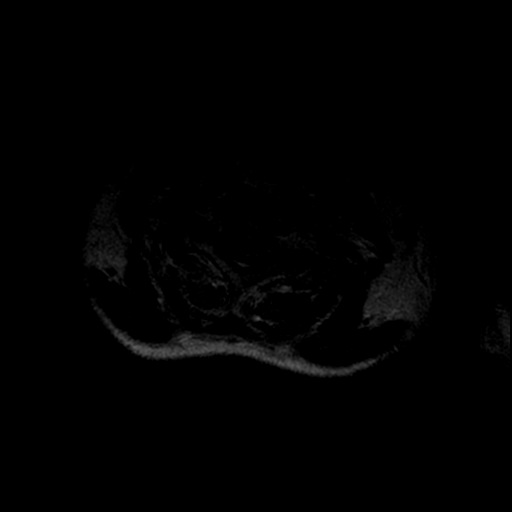
[im 14/33]
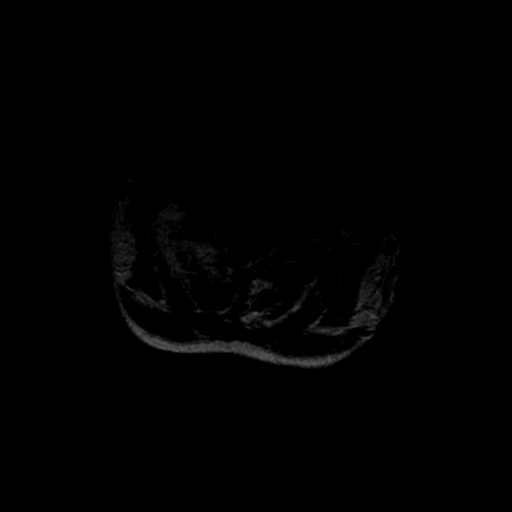
[im 17/33]
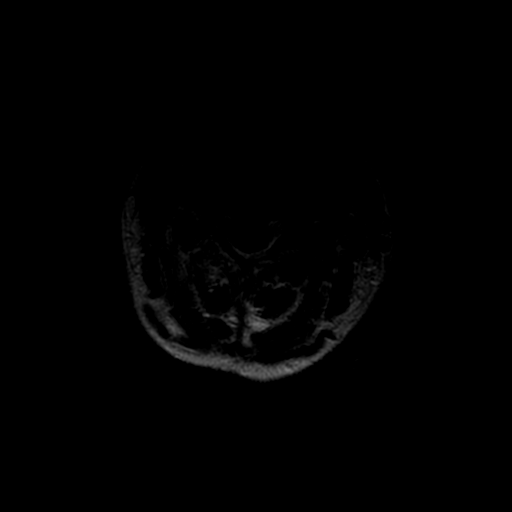
[im 19/33]
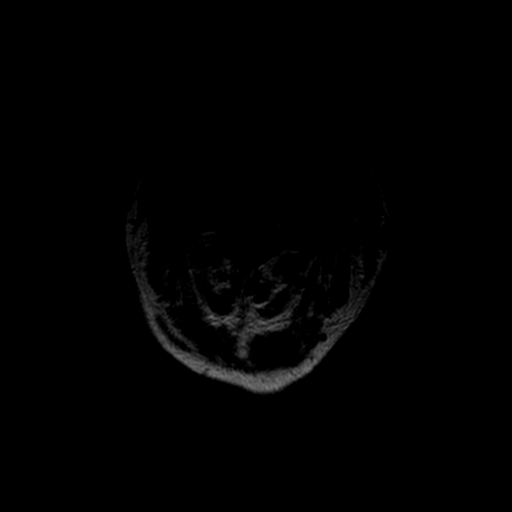
[im 22/33]
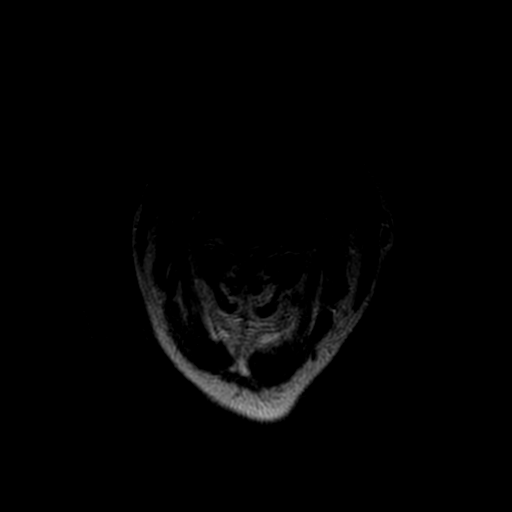
[im 27/33]
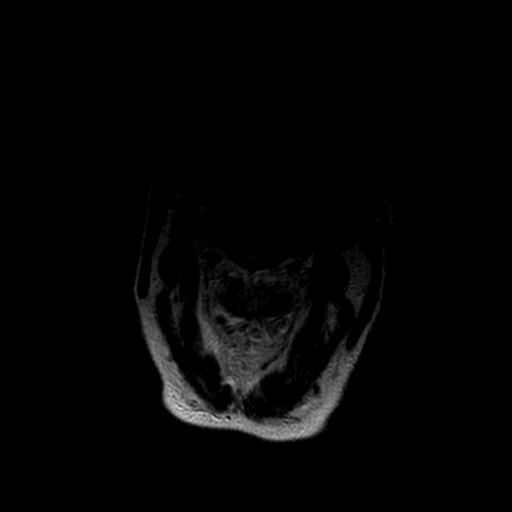

[19 of 48 positions shown; findings below may reference images not displayed]

FINDINGS: Alignment: 5 mm anterolisthesis C6-7 related to fractures. Remaining
alignment normal.

Image quality degraded by motion.

Vertebrae: Fractures are better seen on recent CT. In summary, there
are bilateral laminar fractures of C6. Displaced facet fracture on
the left at C6-7. Superior articulating facet of C7 projects into
the foramen with severe left foraminal stenosis. Possible small
extradural hematoma on the left at C6-7. Bilateral C7 transverse
process fractures. Fracture of the inferior articulating facet of C6
on the left.

Cord: Negative for cord compression. Limited cord evaluation due to
motion. No cord edema identified.

Posterior Fossa, vertebral arteries, paraspinal tissues: Negative

Disc levels:

C2-3: Negative

C3-4: Negative

C4-5: Negative

C5-6: Mild disc degeneration. Mild left facet degeneration without
stenosis

C6-7: Fracture dislocation as above with severe left foraminal
encroachment due to bony fragment possible extradural hemorrhage.
Cord flattening and mild spinal stenosis.

C7-T1: Bilateral facet degeneration.  Negative for stenosis.
IMPRESSION: 1. Image quality degraded by motion. Limited cord evaluation. No
evidence of cord edema. The patient has a relatively large spinal
canal
2. Fracture dislocation at C6-7 on the left. Bone fragment extending
in the left foramen with marked left foraminal encroachment and
possible small extradural hemorrhage in the foramen. Mild cord
flattening and mild spinal stenosis.
3. Mild degenerative change elsewhere without additional cervical
spine stenosis.

## 2022-02-20 ENCOUNTER — Emergency Department (HOSPITAL_COMMUNITY)

## 2022-02-20 ENCOUNTER — Emergency Department (HOSPITAL_COMMUNITY)
Admission: EM | Admit: 2022-02-20 | Discharge: 2022-02-20 | Disposition: A | Discharge status: Home / Self Care | Attending: Emergency Medicine | Admitting: Emergency Medicine

## 2022-02-20 ENCOUNTER — Other Ambulatory Visit: Payer: Self-pay

## 2022-02-20 ENCOUNTER — Encounter (HOSPITAL_COMMUNITY): Payer: Self-pay | Admitting: *Deleted

## 2022-02-20 DIAGNOSIS — X500XXA Overexertion from strenuous movement or load, initial encounter: Secondary | ICD-10-CM | POA: Diagnosis not present

## 2022-02-20 DIAGNOSIS — M542 Cervicalgia: Secondary | ICD-10-CM | POA: Diagnosis present

## 2022-02-20 DIAGNOSIS — Y990 Civilian activity done for income or pay: Secondary | ICD-10-CM | POA: Diagnosis not present

## 2022-02-20 DIAGNOSIS — Y92513 Shop (commercial) as the place of occurrence of the external cause: Secondary | ICD-10-CM | POA: Insufficient documentation

## 2022-02-20 DIAGNOSIS — M5412 Radiculopathy, cervical region: Secondary | ICD-10-CM | POA: Diagnosis not present

## 2022-02-20 MED ORDER — METHOCARBAMOL 500 MG PO TABS: 500.0000 mg | ORAL_TABLET | Freq: Three times a day (TID) | ORAL | 0 refills | Status: DC

## 2022-02-20 MED ORDER — PREDNISONE 10 MG (21) PO TBPK: ORAL_TABLET | Freq: Every day | ORAL | 0 refills | Status: AC

## 2022-02-20 NOTE — ED Triage Notes (Signed)
Pt had spinal fusion around 4 years ago and has been doing well.  Pt reports neck pain pain x1 week with pain in arm and numbness in hands.  Not associated with any trauma but pain similar as before she had spinal fusion.

## 2022-02-20 NOTE — ED Notes (Signed)
Pt returned from CT °

## 2022-02-20 NOTE — Discharge Instructions (Signed)
Take medication as active.  You may alternate ice and heat to your neck.  Follow-up with your primary care provider for recheck or follow back up with your neurosurgery group.  Return to the emergency department for any new or worsening symptoms.

## 2022-02-22 NOTE — ED Provider Notes (Signed)
[facility] Provider Note   CSN: [id number] Arrival date & time: 02/20/22  1850     History  Chief Complaint  Patient presents with   Neck Pain    Crystal Richards is a 62 y.o. female.   Neck Pain Associated symptoms: numbness and weakness   Associated symptoms: no chest pain and no headaches        Crystal Richards is a 62 y.o. female with hx of prior cervical fusion 4 years ago who presents to the Emergency Department complaining of neck pain x 1 week.  States that she works at a [employer] and is required to do heavy lifting.  She has been having numbness and tingling in fingers of the left hand.  Pain worse with movement. She denies trauma, chest pain and shortness of breath.    Home Medications Prior to Admission medications   Medication Sig Start Date End Date Taking? Authorizing Provider  methocarbamol (ROBAXIN) 500 MG tablet Take 1 tablet (500 mg total) by mouth 3 (three) times daily. 02/20/22  Yes Imo Cumbie, PA-C  predniSONE (STERAPRED UNI-PAK 21 TAB) 10 MG (21) TBPK tablet Take by mouth daily. Take 6 tabs by mouth daily  for 2 days, then 5 tabs for 2 days, then 4 tabs for 2 days, then 3 tabs for 2 days, 2 tabs for 2 days, then 1 tab by mouth daily for 2 days 02/20/22  Yes Nazareth Kirk, PA-C  cetirizine (ZYRTEC ALLERGY) 10 MG tablet Take 1 tablet (10 mg total) by mouth daily. 01/25/19   Avegno, Darrelyn Hillock, FNP  naproxen (NAPROSYN) 500 MG tablet Take 500 mg by mouth daily as needed for mild pain.    [provider]  triamcinolone cream (KENALOG) 0.1 % Apply 1 application topically 2 (two) times daily. 01/25/19   Emerson Monte, FNP      Allergies    Amoxicillin, Azithromycin, Cephalexin, and Ciprofloxacin    Review of Systems   Review of Systems  Constitutional:  Negative for appetite change and chills.  Eyes:  Negative for visual disturbance.  Respiratory:  Negative for shortness of breath.    Cardiovascular:  Negative for chest pain.  Gastrointestinal:  Negative for nausea and vomiting.  Musculoskeletal:  Positive for neck pain. Negative for back pain.  Skin:  Negative for color change, rash and wound.  Neurological:  Positive for weakness and numbness. Negative for dizziness, facial asymmetry and headaches.    Physical Exam Updated Vital Signs BP (!) 146/77 (BP Location: Right Arm)   Pulse 94   Temp 98.4 F (36.9 C) (Oral)   Resp 14   LMP 08/28/2010   SpO2 99%  Physical Exam Vitals and nursing note reviewed.  Constitutional:      General: She is not in acute distress.    Appearance: Normal appearance. She is not ill-appearing or toxic-appearing.  Eyes:     Extraocular Movements: Extraocular movements intact.     Conjunctiva/sclera: Conjunctivae normal.  Neck:     Trachea: Phonation normal.     Meningeal: Kernig's sign absent.     Comments: Pain with rotation of the neck.  No pain with flexion/extension.  Cardiovascular:     Rate and Rhythm: Normal rate and regular rhythm.     Pulses: Normal pulses.  Pulmonary:     Effort: Pulmonary effort is normal.     Breath sounds: Normal breath sounds.  Chest:     Chest wall: No tenderness.  Musculoskeletal:  General: Tenderness present. No swelling, deformity or signs of injury.     Cervical back: Tenderness present. No rigidity or crepitus. Spinous process tenderness and muscular tenderness present.  Skin:    General: Skin is warm.     Capillary Refill: Capillary refill takes less than 2 seconds.     Findings: No rash.  Neurological:     General: No focal deficit present.     Mental Status: She is alert.     Sensory: Sensation is intact. No sensory deficit.     Motor: Motor function is intact. No weakness.     Coordination: Coordination is intact.     ED Results / Procedures / Treatments   Labs (all labs ordered are listed, but only abnormal results are displayed) Labs Reviewed - No data to  display  EKG None  Radiology CT Cervical Spine Wo Contrast  Result Date: 02/20/2022 CLINICAL DATA:  Neck pain history of surgery EXAM: CT CERVICAL SPINE WITHOUT CONTRAST TECHNIQUE: Multidetector CT imaging of the cervical spine was performed without intravenous contrast. Multiplanar CT image reconstructions were also generated. RADIATION DOSE REDUCTION: This exam was performed according to the departmental dose-optimization program which includes automated exposure control, adjustment of the mA and/or kV according to patient size and/or use of iterative reconstruction technique. COMPARISON:  MRI 12/23/2017, CT 12/22/2017 FINDINGS: Alignment: Normal. Skull base and vertebrae: No acute fracture. No primary bone lesion or focal pathologic process. Soft tissues and spinal canal: No prevertebral fluid or swelling. No visible canal hematoma. Disc levels: Anterior fusion C6-C7. Intact appearing hardware. Mild disc space narrowing C4-C5 and C5-C6 with mild foraminal narrowing at these levels. Upper chest: Negative. Other: None IMPRESSION: Anterior fusion C6-C7. No acute osseous abnormality. Mild degenerative changes C4-C5 and C5-C6. Electronically Signed   By: Donavan Foil M.D.   On: 02/20/2022 22:36    Procedures Procedures    Medications Ordered in ED Medications - No data to display  ED Course/ Medical Decision Making/ A&P                             Medical Decision Making Pt here with neck pain x 1 week.  Hx of cervical fusion having paraesthesias of the left hand.  No weakness of the upper extremities.  No headache or dizziness.    Diff dx includes cervical radiculopathy, fx, MSK, herniated disk doubt spinal abscess but considered  Amount and/or Complexity of Data Reviewed Radiology: ordered.    Details: CT cervical spine w/o acute bony abnml.  Hardware intact  Discussion of management or test interpretation with external provider(s): Doubt emergent process.  Likely radicular pain.  Will  treat with muscle relaxer, steroid taper.  She will f/u with neurosurgery as she does not currently have PCP.  Return precuations discussed  Risk Prescription drug management.           Final Clinical Impression(s) / ED Diagnoses Final diagnoses:  Cervical radiculopathy    Rx / DC Orders ED Discharge Orders          Ordered    methocarbamol (ROBAXIN) 500 MG tablet  3 times daily        02/20/22 2246    predniSONE (STERAPRED UNI-PAK 21 TAB) 10 MG (21) TBPK tablet  Daily        02/20/22 2246              Kem Parkinson, PA-C 02/22/22 2159    Godfrey Pick, MD  02/25/22 0735  

## 2022-08-12 ENCOUNTER — Emergency Department (HOSPITAL_COMMUNITY)

## 2022-08-12 ENCOUNTER — Other Ambulatory Visit: Payer: Self-pay

## 2022-08-12 ENCOUNTER — Emergency Department (HOSPITAL_COMMUNITY)
Admission: EM | Admit: 2022-08-12 | Discharge: 2022-08-12 | Disposition: A | Discharge status: Home / Self Care | Attending: Emergency Medicine | Admitting: Emergency Medicine

## 2022-08-12 ENCOUNTER — Encounter (HOSPITAL_COMMUNITY): Payer: Self-pay | Admitting: Emergency Medicine

## 2022-08-12 DIAGNOSIS — Z20822 Contact with and (suspected) exposure to covid-19: Secondary | ICD-10-CM | POA: Insufficient documentation

## 2022-08-12 DIAGNOSIS — R0602 Shortness of breath: Secondary | ICD-10-CM | POA: Insufficient documentation

## 2022-08-12 DIAGNOSIS — R251 Tremor, unspecified: Secondary | ICD-10-CM | POA: Diagnosis not present

## 2022-08-12 HISTORY — DX: Depression, unspecified: F32.A

## 2022-08-12 HISTORY — DX: Congenital hypertrophic pyloric stenosis: Q40.0

## 2022-08-12 LAB — CBC WITH DIFFERENTIAL/PLATELET
Abs Immature Granulocytes: 0.03 10*3/uL (ref 0.00–0.07)
Basophils Absolute: 0.1 10*3/uL (ref 0.0–0.1)
Basophils Relative: 1 %
Eosinophils Absolute: 0 10*3/uL (ref 0.0–0.5)
Eosinophils Relative: 0 %
HCT: 43.6 % (ref 36.0–46.0)
Hemoglobin: 15.5 g/dL — ABNORMAL HIGH (ref 12.0–15.0)
Immature Granulocytes: 0 %
Lymphocytes Relative: 16 %
Lymphs Abs: 1.5 10*3/uL (ref 0.7–4.0)
MCH: 37.3 pg — ABNORMAL HIGH (ref 26.0–34.0)
MCHC: 35.6 g/dL (ref 30.0–36.0)
MCV: 104.8 fL — ABNORMAL HIGH (ref 80.0–100.0)
Monocytes Absolute: 0.7 10*3/uL (ref 0.1–1.0)
Monocytes Relative: 7 %
Neutro Abs: 7.4 10*3/uL (ref 1.7–7.7)
Neutrophils Relative %: 76 %
Platelets: 237 10*3/uL (ref 150–400)
RBC: 4.16 MIL/uL (ref 3.87–5.11)
RDW: 12 % (ref 11.5–15.5)
WBC: 9.7 10*3/uL (ref 4.0–10.5)
nRBC: 0 % (ref 0.0–0.2)

## 2022-08-12 LAB — COMPREHENSIVE METABOLIC PANEL
ALT: 195 U/L — ABNORMAL HIGH (ref 0–44)
AST: 141 U/L — ABNORMAL HIGH (ref 15–41)
Albumin: 4 g/dL (ref 3.5–5.0)
Alkaline Phosphatase: 121 U/L (ref 38–126)
Anion gap: 14 (ref 5–15)
BUN: 8 mg/dL (ref 8–23)
CO2: 18 mmol/L — ABNORMAL LOW (ref 22–32)
Calcium: 9.4 mg/dL (ref 8.9–10.3)
Chloride: 101 mmol/L (ref 98–111)
Creatinine, Ser: 0.63 mg/dL (ref 0.44–1.00)
GFR, Estimated: >60 mL/min (ref >60)
Glucose, Bld: 146 mg/dL — ABNORMAL HIGH (ref 70–99)
Potassium: 3.9 mmol/L (ref 3.5–5.1)
Sodium: 133 mmol/L — ABNORMAL LOW (ref 135–145)
Total Bilirubin: 1.5 mg/dL — ABNORMAL HIGH (ref 0.3–1.2)
Total Protein: 7 g/dL (ref 6.5–8.1)

## 2022-08-12 LAB — RESP PANEL BY RT-PCR (RSV, FLU A&B, COVID)  RVPGX2
Influenza A by PCR: NEGATIVE
Influenza B by PCR: NEGATIVE
Resp Syncytial Virus by PCR: NEGATIVE
SARS Coronavirus 2 by RT PCR: NEGATIVE

## 2022-08-12 LAB — MAGNESIUM: Magnesium: 1.8 mg/dL (ref 1.7–2.4)

## 2022-08-12 NOTE — ED Provider Notes (Signed)
[facility] EMERGENCY DEPARTMENT AT [facility] Provider Note   CSN: [id number] Arrival date & time: 08/12/22  0102     History  Chief Complaint  Patient presents with   Tremors    Crystal Richards is a 62 y.o. female.  53 female presents today for tremors.  Ongoing for the past week or so the past 2 days.  Worse in the sense that they have become constant.  3 weeks ago she started sertraline.  She states she got this prescribed through an online service.  She does not have a PCP.  She denies chest pain, abdominal pain.  She does endorse some shortness of breath.  Denies any URI symptoms.  The history is provided by the patient. No language interpreter was used.       Home Medications Prior to Admission medications   Medication Sig Start Date End Date Taking? Authorizing Provider  cetirizine (ZYRTEC ALLERGY) 10 MG tablet Take 1 tablet (10 mg total) by mouth daily. 01/25/19   Avegno, Zachery Dakins, FNP  methocarbamol (ROBAXIN) 500 MG tablet Take 1 tablet (500 mg total) by mouth 3 (three) times daily. 02/20/22   Triplett, Tammy, PA-C  naproxen (NAPROSYN) 500 MG tablet Take 500 mg by mouth daily as needed for mild pain.    [provider]  predniSONE (STERAPRED UNI-PAK 21 TAB) 10 MG (21) TBPK tablet Take by mouth daily. Take 6 tabs by mouth daily  for 2 days, then 5 tabs for 2 days, then 4 tabs for 2 days, then 3 tabs for 2 days, 2 tabs for 2 days, then 1 tab by mouth daily for 2 days 02/20/22   Triplett, Tammy, PA-C  triamcinolone cream (KENALOG) 0.1 % Apply 1 application topically 2 (two) times daily. 01/25/19   Durward Parcel, FNP      Allergies    Amoxicillin, Azithromycin, Cephalexin, and Ciprofloxacin    Review of Systems   Review of Systems  Constitutional:  Negative for chills and fever.  Respiratory:  Positive for shortness of breath. Negative for cough.   Cardiovascular:  Negative for chest pain, palpitations and leg swelling.  Gastrointestinal:   Negative for abdominal pain, nausea and vomiting.  Neurological:  Positive for tremors. Negative for weakness.  All other systems reviewed and are negative.   Physical Exam Updated Vital Signs BP 132/83   Pulse 74   Temp 98.4 F (36.9 C) (Oral)   Resp 14   Ht 5\' 7"  (1.702 m)   Wt 68 kg   LMP 08/28/2010   SpO2 99%   BMI 23.49 kg/m  Physical Exam Vitals and nursing note reviewed.  Constitutional:      General: She is not in acute distress.    Appearance: Normal appearance. She is not ill-appearing.  HENT:     Head: Normocephalic and atraumatic.     Nose: Nose normal.  Eyes:     General: No scleral icterus.    Extraocular Movements: Extraocular movements intact.     Conjunctiva/sclera: Conjunctivae normal.  Cardiovascular:     Rate and Rhythm: Normal rate and regular rhythm.     Heart sounds: Normal heart sounds.  Pulmonary:     Effort: Pulmonary effort is normal. No respiratory distress.     Breath sounds: Normal breath sounds. No wheezing or rales.  Abdominal:     General: There is no distension.     Tenderness: There is no abdominal tenderness.  Musculoskeletal:        General: Normal  range of motion.     Cervical back: Normal range of motion.  Skin:    General: Skin is warm and dry.  Neurological:     General: No focal deficit present.     Mental Status: She is alert. Mental status is at baseline.     ED Results / Procedures / Treatments   Labs (all labs ordered are listed, but only abnormal results are displayed) Labs Reviewed  CBC WITH DIFFERENTIAL/PLATELET - Abnormal; Notable for the following components:      Result Value   Hemoglobin 15.5 (*)    MCV 104.8 (*)    MCH 37.3 (*)    All other components within normal limits  COMPREHENSIVE METABOLIC PANEL - Abnormal; Notable for the following components:   Sodium 133 (*)    CO2 18 (*)    Glucose, Bld 146 (*)    AST 141 (*)    ALT 195 (*)    Total Bilirubin 1.5 (*)    All other components within  normal limits  RESP PANEL BY RT-PCR (RSV, FLU A&B, COVID)  RVPGX2  MAGNESIUM  HEPATITIS PANEL, ACUTE    EKG None  Radiology DG Chest 2 View  Result Date: 08/12/2022 CLINICAL DATA:  Dyspnea. EXAM: CHEST - 2 VIEW COMPARISON:  None Available. FINDINGS: The heart size and mediastinal contours are within normal limits. Both lungs are clear. The visualized skeletal structures are unremarkable. IMPRESSION: No active cardiopulmonary disease. Electronically Signed   By: Lupita Raider M.D.   On: 08/12/2022 16:49    Procedures Procedures    Medications Ordered in ED Medications - No data to display  ED Course/ Medical Decision Making/ A&P                             Medical Decision Making Amount and/or Complexity of Data Reviewed Labs: ordered. Radiology: ordered.   62 year old female presents today for evaluation of generalized tremors.  Started 1 week ago.  Became constant 2 days ago.  Recently started sertraline which is a new medication for her 3 weeks ago.  She does not have a PCP and had this prescribed through an online service.  Workup obtained in the emergency department included CBC which shows no leukocytosis.  Without anemia.  CMP showed normal electrolytes.  Glucose was elevated to 146.  She is not a diabetic.  LFTs elevated mildly.  Again without abdominal pain.  Hepatitis panel ordered.  She will follow this up with the PCP.  PCP referral given.  Respiratory panel negative.  Chest x-ray without acute cardiopulmonary process.  Discharged in stable condition.  Return precaution discussed.  Patient states over the past week she has started to wean off of the sertraline.  She has been taking 1 pill every other day.  Discussed abruptly stopping sertraline is not advised.  She will continue to wean off of this.  She will follow-up with the online service that prescribed the medication as well as establish care with the referral that I am providing her with.  Final Clinical  Impression(s) / ED Diagnoses Final diagnoses:  Tremor    Rx / DC Orders ED Discharge Orders     None         Marita Kansas, PA-C 08/12/22 1929    Terrilee Files, MD 08/13/22 [phone]

## 2022-08-12 NOTE — Discharge Instructions (Addendum)
Your workup today was overall reassuring.  Tremor could be associated with sertraline you just started.  Continue to wean off of this medication as you are doing.  We want to ensure that the weaning process does not take any less than 2 weeks.  Establish care with the clinic that I have attached above for you.  If you have any concerning symptoms return to the emergency room.  No concerning cause of your shortness of breath either.

## 2022-08-12 NOTE — ED Triage Notes (Signed)
Pt via [facility] from home c/o tremors for about a week, possibly due to recently starting new rx for sertraline. Pt also notes some mild SOB.   Vitals WNL en route per EMS.

## 2023-05-05 ENCOUNTER — Ambulatory Visit
Admission: EM | Admit: 2023-05-05 | Discharge: 2023-05-05 | Disposition: A | Discharge status: Home / Self Care | Payer: Self-pay

## 2023-05-05 DIAGNOSIS — M720 Palmar fascial fibromatosis [Dupuytren]: Secondary | ICD-10-CM

## 2023-05-05 NOTE — ED Triage Notes (Signed)
 Bump on left hand/palm x 3 months. No recent injuries or falls.

## 2023-05-05 NOTE — Discharge Instructions (Addendum)
 You have been diagnosed with Dupuytren syndrome of the left hand.  Due to you not having any active contracture of the fingers the recommendation is to continue to monitor.  As discussed recommendation would be to follow-up with orthopedist via one of the walk-in clinics if symptoms persist or get worse for imaging and treatment options.  You have been provided some information on Dupuytren's contraction.

## 2023-05-05 NOTE — ED Provider Notes (Signed)
 [facility]    CSN: [id number] Arrival date & time: 05/05/23  1133      History   Chief Complaint Chief Complaint  Patient presents with   Hand Injury    HPI Crystal Richards is a 63 y.o. female.   HPI  Is in today for evaluation of left hand.  She has noticed that she has 3 nodules in the palm of her left hand.  She denies any pain or recent injury.  She does have a history of cervical fracture for which she has had repair.  She endorses that she has numbness in her left hand.  She reports that it was fingers 2 and 3 however now she has numbness in all fingers.  She denies any problems with range of motion.  She denies tingling or weakness. 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     Patient Active Problem List   Diagnosis Date Noted   S/P cervical spinal fusion 12/24/2017   C6 cervical fracture (HCC) 12/22/2017   History of cervical fracture 12/22/2017    Past Surgical History:  Procedure Laterality Date   ANTERIOR CERVICAL DECOMP/DISCECTOMY FUSION N/A 12/24/2017   Procedure: CERVICAL SIX-SEVEN  ANTERIOR CERVICAL DECOMPRESSION/DISCECTOMY FUSION;  Surgeon: Tia Alert, MD;  Location: [facility] OR;  Service: Neurosurgery;  Laterality: N/A;   CESAREAN SECTION     COLPOSCOPY  02/2010   + ECC LGSIL fragment   TONSILLECTOMY AND ADENOIDECTOMY     TUBAL LIGATION      OB History     Gravida  3   Para  3   Term      Preterm      AB      Living         SAB      IAB      Ectopic      Multiple      Live Births               Home Medications    Prior to Admission medications   Medication Sig Start Date End Date Taking? Authorizing Provider  cetirizine (ZYRTEC ALLERGY) 10 MG tablet Take 1 tablet (10 mg total) by mouth daily. 01/25/19   Avegno, Zachery Dakins, FNP  methocarbamol (ROBAXIN) 500 MG tablet  Take 1 tablet (500 mg total) by mouth 3 (three) times daily. 02/20/22   Triplett, Tammy, PA-C  naproxen (NAPROSYN) 500 MG tablet Take 500 mg by mouth daily as needed for mild pain.    [provider]  predniSONE (STERAPRED UNI-PAK 21 TAB) 10 MG (21) TBPK tablet Take by mouth daily. Take 6 tabs by mouth daily  for 2 days, then 5 tabs for 2 days, then 4 tabs for 2 days, then 3 tabs for 2 days, 2 tabs for 2 days, then 1 tab by mouth daily for 2 days 02/20/22   Triplett, Tammy, PA-C  triamcinolone cream (KENALOG) 0.1 % Apply 1 application topically 2 (two) times daily. 01/25/19   AvegnoZachery Dakins, FNP    Family History Family History  Problem Relation Age of Onset   Diabetes Mother    Hypertension Mother    Healthy Father     Social History Social History   Tobacco Use   Smoking status: Every Day    Current packs/day: 1.00    Types: Cigarettes  Smokeless tobacco: Never  Vaping Use   Vaping status: Never Used  Substance Use Topics   Alcohol use: Yes    Comment: occassionally   Drug use: No     Allergies   Amoxicillin, Azithromycin, Cephalexin, and Ciprofloxacin   Review of Systems Review of Systems   Physical Exam Triage Vital Signs ED Triage Vitals  Encounter Vitals Group     BP 05/05/23 1155 (!) 145/78     Systolic BP Percentile --      Diastolic BP Percentile --      Pulse Rate 05/05/23 1155 92     Resp 05/05/23 1155 16     Temp 05/05/23 1155 98.1 F (36.7 C)     Temp Source 05/05/23 1155 Oral     SpO2 05/05/23 1155 94 %     Weight --      Height --      Head Circumference --      Peak Flow --      Pain Score 05/05/23 1158 0     Pain Loc --      Pain Education --      Exclude from Growth Chart --    No data found.  Updated Vital Signs BP (!) 145/78 (BP Location: Right Arm)   Pulse 92   Temp 98.1 F (36.7 C) (Oral)   Resp 16   LMP 08/28/2010   SpO2 94%   Visual Acuity Right Eye Distance:   Left Eye Distance:   Bilateral Distance:     Right Eye Near:   Left Eye Near:    Bilateral Near:     Physical Exam Constitutional:      General: She is not in acute distress.    Appearance: She is normal weight.  HENT:     Head: Normocephalic.  Cardiovascular:     Rate and Rhythm: Normal rate.  Pulmonary:     Effort: Pulmonary effort is normal.  Musculoskeletal:     Right hand: Normal.     Left hand: Deformity present. No swelling, tenderness or bony tenderness. Normal range of motion. Normal strength. Normal sensation. There is no disruption of two-point discrimination. Normal capillary refill. Normal pulse.       Arms:  Skin:    General: Skin is warm and dry.     Capillary Refill: Capillary refill takes less than 2 seconds.  Neurological:     General: No focal deficit present.     Mental Status: She is alert and oriented to person, place, and time.  Psychiatric:        Mood and Affect: Mood normal.        Behavior: Behavior normal.      UC Treatments / Results  Labs (all labs ordered are listed, but only abnormal results are displayed) Labs Reviewed - No data to display  EKG   Radiology No results found.  Procedures Procedures (including critical care time)  Medications Ordered in UC Medications - No data to display  Initial Impression / Assessment and Plan / UC Course  I have reviewed the triage vital signs and the nursing notes.  Pertinent labs & imaging results that were available during my care of the patient were reviewed by me and considered in my medical decision making (see chart for details).     Hand pain  Final Clinical Impressions(s) / UC Diagnoses   Final diagnoses:  Dupuytren disease of palm     Discharge Instructions      You have  been diagnosed with Dupuytren syndrome of the left hand.  Due to you not having any active contracture of the fingers the recommendation is to continue to monitor.  As discussed recommendation would be to follow-up with orthopedist via one of the  walk-in clinics if symptoms persist or get worse for imaging and treatment options.  You have been provided some information on Dupuytren's contraction.       ED Prescriptions   None    PDMP not reviewed this encounter.   Thad Ranger Bra[place], [place] 05/05/23 1229

## 2023-05-10 ENCOUNTER — Emergency Department (HOSPITAL_COMMUNITY)
Admission: EM | Admit: 2023-05-10 | Discharge: 2023-05-10 | Disposition: A | Discharge status: Home / Self Care | Payer: Self-pay | Attending: Emergency Medicine | Admitting: Emergency Medicine

## 2023-05-10 ENCOUNTER — Emergency Department (HOSPITAL_COMMUNITY): Payer: Self-pay

## 2023-05-10 ENCOUNTER — Other Ambulatory Visit: Payer: Self-pay

## 2023-05-10 ENCOUNTER — Encounter (HOSPITAL_COMMUNITY): Payer: Self-pay | Admitting: *Deleted

## 2023-05-10 DIAGNOSIS — S161XXA Strain of muscle, fascia and tendon at neck level, initial encounter: Secondary | ICD-10-CM | POA: Insufficient documentation

## 2023-05-10 DIAGNOSIS — X58XXXA Exposure to other specified factors, initial encounter: Secondary | ICD-10-CM | POA: Insufficient documentation

## 2023-05-10 DIAGNOSIS — Z79899 Other long term (current) drug therapy: Secondary | ICD-10-CM | POA: Insufficient documentation

## 2023-05-10 MED ORDER — LIDOCAINE 5 % EX PTCH: 1.0000 | MEDICATED_PATCH | CUTANEOUS | 0 refills | Status: AC

## 2023-05-10 MED ORDER — KETOROLAC TROMETHAMINE 15 MG/ML IJ SOLN
15.0000 mg | Freq: Once | INTRAMUSCULAR | Status: AC
  Administered 2023-05-10: 15 mg via INTRAMUSCULAR
  Filled 2023-05-10: qty 1

## 2023-05-10 MED ORDER — METHOCARBAMOL 500 MG PO TABS: 500.0000 mg | ORAL_TABLET | Freq: Three times a day (TID) | ORAL | 0 refills | Status: AC

## 2023-05-10 NOTE — ED Notes (Addendum)
 Pt ambulated to ED room. A&Ox4. Pt stated, "neck pain started about 2 days ago. I was able to turn fully to my right side. I have to move my entire body. I had surgery years ago and was fine after. Then I started having numbness in my left hand now my neck is bothering me."  Pt is able to turn neck fully to left, up, and down with no discomfort. Denies having discomfort/pain elsewhere. Denies injury/trauma. Lymph nodes are not viosibly swollen to this nurse. Neck appears symmetrical and non swollen.

## 2023-05-10 NOTE — ED Provider Notes (Signed)
 [place] EMERGENCY DEPARTMENT AT [facility] Provider Note   CSN: [id number] Arrival date & time: 05/10/23  1209     History  Chief Complaint  Patient presents with   Torticollis    Crystal Richards is a 63 y.o. female.  She has history of cervical fusion in 2019 presents the ER today complaining of right sided neck pain worse with rotating her head side-to-side, particularly rotating to the right.  She noticed it yesterday morning and thought she had "slept wrong" she gave it a day but is still having pain today and wants to make sure there is nothing wrong as she had not had any specific follow-up after her neck surgery in 2019.  She denies numbness tingling or weakness in her arms outside of her baseline numbness in her left fingers which is unchanged.  She denies injury or trauma, denies heavy lifting outside of lifting her grandchildren up.  She denies fever or chills, no other complaints.  HPI     Home Medications Prior to Admission medications   Medication Sig Start Date End Date Taking? Authorizing Provider  lidocaine (LIDODERM) 5 % Place 1 patch onto the skin daily. Remove & Discard patch within 12 hours or as directed by MD 05/10/23  Yes Latricia Poles, Arieal Cuoco A, PA-C  cetirizine (ZYRTEC ALLERGY) 10 MG tablet Take 1 tablet (10 mg total) by mouth daily. 01/25/19   Avegno, Komlanvi S, FNP  methocarbamol (ROBAXIN) 500 MG tablet Take 1 tablet (500 mg total) by mouth 3 (three) times daily. 05/10/23   Baxter Limber A, PA-C  naproxen (NAPROSYN) 500 MG tablet Take 500 mg by mouth daily as needed for mild pain.    [provider]  predniSONE (STERAPRED UNI-PAK 21 TAB) 10 MG (21) TBPK tablet Take by mouth daily. Take 6 tabs by mouth daily  for 2 days, then 5 tabs for 2 days, then 4 tabs for 2 days, then 3 tabs for 2 days, 2 tabs for 2 days, then 1 tab by mouth daily for 2 days 02/20/22   Triplett, Tammy, PA-C  triamcinolone cream (KENALOG) 0.1 % Apply 1 application topically 2  (two) times daily. 01/25/19   Avegno, Komlanvi S, FNP      Allergies    Amoxicillin, Azithromycin, Cephalexin, and Ciprofloxacin    Review of Systems   Review of Systems  Physical Exam Updated Vital Signs BP 130/85 (BP Location: Right Arm)   Pulse 66   Temp 97.9 F (36.6 C) (Oral)   Resp 16   Ht 5\' 7"  (1.702 m)   Wt 59 kg   LMP 08/28/2010   SpO2 97%   BMI 20.36 kg/m  Physical Exam Vitals and nursing note reviewed.  Constitutional:      General: She is not in acute distress.    Appearance: She is well-developed.  HENT:     Head: Normocephalic and atraumatic.  Eyes:     Conjunctiva/sclera: Conjunctivae normal.  Neck:     Comments: Patient can flex extend her neck, rotating her head to the right is limited due to pain, rotation to the left is only slightly limited due to pain. Cardiovascular:     Rate and Rhythm: Normal rate and regular rhythm.     Heart sounds: No murmur heard. Pulmonary:     Effort: Pulmonary effort is normal. No respiratory distress.     Breath sounds: Normal breath sounds.  Abdominal:     Palpations: Abdomen is soft.     Tenderness: There is  no abdominal tenderness.  Musculoskeletal:        General: No swelling.     Cervical back: Neck supple.  Skin:    General: Skin is warm and dry.     Capillary Refill: Capillary refill takes less than 2 seconds.  Neurological:     General: No focal deficit present.     Mental Status: She is alert and oriented to person, place, and time.     Sensory: No sensory deficit.     Motor: No weakness.     Gait: Gait normal.  Psychiatric:        Mood and Affect: Mood normal.     ED Results / Procedures / Treatments   Labs (all labs ordered are listed, but only abnormal results are displayed) Labs Reviewed - No data to display  EKG None  Radiology DG Cervical Spine 2-3 Views Result Date: 05/10/2023 CLINICAL DATA:  Trauma.  Neck pain EXAM: CERVICAL SPINE - 2-3 VIEW COMPARISON:  None Available. FINDINGS:  Anterior cervical fusion at C6-C7. Normal alignment of vertebral bodies. Normal spinal laminal line. Open mouth odontoid view is normal. No subluxation. IMPRESSION: No acute findings the cervical spine. Electronically Signed   By: Deboraha Fallow M.D.   On: 05/10/2023 14:09    Procedures Procedures    Medications Ordered in ED Medications  ketorolac (TORADOL) 15 MG/ML injection 15 mg (15 mg Intramuscular Given 05/10/23 1350)    ED Course/ Medical Decision Making/ A&P                                 Medical Decision Making DDx: Fracture, strain, arthritis, HNP, epidural abscess, other  ED course: Patient here with atraumatic neck pain that started yesterday.  She has no constitutional symptoms, no lymphadenopathy.  Denies injury or trauma, no dyspnea or weakness, no paresthesias, no bowel or bladder incontinence.  Do not suspect epidural abscess, cord compression paraspinous abscess or other acute emergent etiology of her pain, likely a muscle strain from lifting her grandchildren up, will treat with conservative measures. Patient was concerned about her hardware so plain films were ordered.  I viewed and interpreted her cervical spine x-rays which show a C6-C7 anterior fusion with no acute findings.  I agree with radiology read. Not feel patient needs any further workup.  She was given IM Toradol in the ED and is having improvement of her symptoms when I reevaluated her.  Advised on neurosurgery follow-up, strict return precautions.   Amount and/or Complexity of Data Reviewed Radiology: ordered and independent interpretation performed. Decision-making details documented in ED Course.  Risk Prescription drug management.           Final Clinical Impression(s) / ED Diagnoses Final diagnoses:  Strain of neck muscle, initial encounter    Rx / DC Orders ED Discharge Orders          Ordered    lidocaine (LIDODERM) 5 %  Every 24 hours        05/10/23 1420    methocarbamol  (ROBAXIN) 500 MG tablet  3 times daily        05/10/23 [address], PA-C 05/10/23 1656    Cheyenne Cotta, MD 05/12/23 1027

## 2023-05-10 NOTE — ED Notes (Signed)
 Patient transported to CT

## 2023-05-10 NOTE — ED Triage Notes (Signed)
 Pt woke up yesterday morning with right sided neck pain, unable to turn her head to right.  Pt with hx of fusion back 2019.

## 2023-05-10 NOTE — Discharge Instructions (Signed)
 You were seen in the ER for right-sided neck pain.  Your x-rays were normal.  You can use over-the-counter Tylenol or ibuprofen as needed for discomfort, as directed on the packaging.  We have prescribed a muscle relaxer, methocarbamol, and lidocaine patches for your discomfort as well.  Follow close with your PCP.  If your symptoms are not getting better you can follow-up with the spine specialist listed on your paperwork.  Come back to the ER if you have new or worsening symptoms.

## 2023-08-11 ENCOUNTER — Emergency Department (HOSPITAL_COMMUNITY): Payer: MEDICAID

## 2023-08-11 ENCOUNTER — Encounter (HOSPITAL_COMMUNITY): Payer: Self-pay

## 2023-08-11 ENCOUNTER — Other Ambulatory Visit: Payer: Self-pay

## 2023-08-11 ENCOUNTER — Emergency Department (HOSPITAL_COMMUNITY)
Admission: EM | Admit: 2023-08-11 | Discharge: 2023-08-11 | Disposition: A | Discharge status: Home / Self Care | Payer: MEDICAID | Attending: Emergency Medicine | Admitting: Emergency Medicine

## 2023-08-11 DIAGNOSIS — F1090 Alcohol use, unspecified, uncomplicated: Secondary | ICD-10-CM | POA: Insufficient documentation

## 2023-08-11 DIAGNOSIS — Y901 Blood alcohol level of 20-39 mg/100 ml: Secondary | ICD-10-CM | POA: Insufficient documentation

## 2023-08-11 DIAGNOSIS — R404 Transient alteration of awareness: Secondary | ICD-10-CM | POA: Insufficient documentation

## 2023-08-11 LAB — CBC WITH DIFFERENTIAL/PLATELET
Abs Immature Granulocytes: 0.03 K/uL (ref 0.00–0.07)
Basophils Absolute: 0 K/uL (ref 0.0–0.1)
Basophils Relative: 0 %
Eosinophils Absolute: 0 K/uL (ref 0.0–0.5)
Eosinophils Relative: 0 %
HCT: 40.8 % (ref 36.0–46.0)
Hemoglobin: 14 g/dL (ref 12.0–15.0)
Immature Granulocytes: 0 %
Lymphocytes Relative: 10 %
Lymphs Abs: 0.9 K/uL (ref 0.7–4.0)
MCH: 37 pg — ABNORMAL HIGH (ref 26.0–34.0)
MCHC: 34.3 g/dL (ref 30.0–36.0)
MCV: 107.9 fL — ABNORMAL HIGH (ref 80.0–100.0)
Monocytes Absolute: 0.4 K/uL (ref 0.1–1.0)
Monocytes Relative: 5 %
Neutro Abs: 7.8 K/uL — ABNORMAL HIGH (ref 1.7–7.7)
Neutrophils Relative %: 85 %
Platelets: 250 K/uL (ref 150–400)
RBC: 3.78 MIL/uL — ABNORMAL LOW (ref 3.87–5.11)
RDW: 12.5 % (ref 11.5–15.5)
WBC: 9.2 K/uL (ref 4.0–10.5)
nRBC: 0 % (ref 0.0–0.2)

## 2023-08-11 LAB — COMPREHENSIVE METABOLIC PANEL WITH GFR
ALT: 99 U/L — ABNORMAL HIGH (ref 0–44)
AST: 83 U/L — ABNORMAL HIGH (ref 15–41)
Albumin: 4 g/dL (ref 3.5–5.0)
Alkaline Phosphatase: 84 U/L (ref 38–126)
Anion gap: 17 — ABNORMAL HIGH (ref 5–15)
BUN: 7 mg/dL — ABNORMAL LOW (ref 8–23)
CO2: 20 mmol/L — ABNORMAL LOW (ref 22–32)
Calcium: 9.3 mg/dL (ref 8.9–10.3)
Chloride: 103 mmol/L (ref 98–111)
Creatinine, Ser: 0.56 mg/dL (ref 0.44–1.00)
GFR, Estimated: >60 mL/min (ref >60)
Glucose, Bld: 110 mg/dL — ABNORMAL HIGH (ref 70–99)
Potassium: 4.2 mmol/L (ref 3.5–5.1)
Sodium: 140 mmol/L (ref 135–145)
Total Bilirubin: 0.9 mg/dL (ref 0.0–1.2)
Total Protein: 7.4 g/dL (ref 6.5–8.1)

## 2023-08-11 LAB — URINALYSIS, ROUTINE W REFLEX MICROSCOPIC
Bilirubin Urine: NEGATIVE
Glucose, UA: NEGATIVE mg/dL
Hgb urine dipstick: NEGATIVE
Ketones, ur: 20 mg/dL — AB
Leukocytes,Ua: NEGATIVE
Nitrite: NEGATIVE
Protein, ur: NEGATIVE mg/dL
Specific Gravity, Urine: 1.021 (ref 1.005–1.030)
pH: 5 (ref 5.0–8.0)

## 2023-08-11 LAB — RAPID URINE DRUG SCREEN, HOSP PERFORMED
Amphetamines: NOT DETECTED
Barbiturates: NOT DETECTED
Benzodiazepines: NOT DETECTED
Cocaine: NOT DETECTED
Opiates: NOT DETECTED
Tetrahydrocannabinol: NOT DETECTED

## 2023-08-11 LAB — ETHANOL: Alcohol, Ethyl (B): 28 mg/dL — ABNORMAL HIGH (ref <15)

## 2023-08-11 NOTE — ED Provider Notes (Addendum)
 [place] EMERGENCY DEPARTMENT AT [facility] Provider Note   CSN: [id number] Arrival date & time: 08/11/23  1756     Patient presents with: Altered Mental Status  HPI Crystal Richards is a 63 y.o. female with remote history of C6 cervical fracture status post cervical spinal fusion presenting for altered mental status. She is accompanied by her son who reports that she was clearly confused this morning and there was a period for 2 to 3 minutes that it seemed like she did not know who he or her daughter was.  Also he reports that she seemed confused at where she was and did not recognize her own home.  She reports that she cannot remember these incidences but is now alert and oriented x 3.  She is denying any pain, weakness or numbness.  Denies fever.  She does report that she drank at least 1 glass of wine this morning and 3 glasses of wine yesterday evening.    Altered Mental Status      Prior to Admission medications   Medication Sig Start Date End Date Taking? Authorizing Provider  cetirizine  (ZYRTEC  ALLERGY) 10 MG tablet Take 1 tablet (10 mg total) by mouth daily. 01/25/19   Avegno, Komlanvi S, FNP  lidocaine  (LIDODERM ) 5 % Place 1 patch onto the skin daily. Remove & Discard patch within 12 hours or as directed by MD 05/10/23   Suellen Cantor A, PA-C  methocarbamol  (ROBAXIN ) 500 MG tablet Take 1 tablet (500 mg total) by mouth 3 (three) times daily. 05/10/23   Suellen Cantor A, PA-C  naproxen  (NAPROSYN ) 500 MG tablet Take 500 mg by mouth daily as needed for mild pain.    [provider]  predniSONE  (STERAPRED UNI-PAK 21 TAB) 10 MG (21) TBPK tablet Take by mouth daily. Take 6 tabs by mouth daily  for 2 days, then 5 tabs for 2 days, then 4 tabs for 2 days, then 3 tabs for 2 days, 2 tabs for 2 days, then 1 tab by mouth daily for 2 days 02/20/22   Triplett, Tammy, PA-C  triamcinolone  cream (KENALOG ) 0.1 % Apply 1 application topically 2 (two) times daily. 01/25/19   Avegno,  Komlanvi S, FNP    Allergies: Amoxicillin, Azithromycin, Cephalexin , and Ciprofloxacin     Review of Systems See HPI  Updated Vital Signs BP 115/69   Pulse 65   Temp 98.6 F (37 C) (Oral)   Resp 20   Ht 5' 7 (1.702 m)   Wt 59 kg   LMP 08/28/2010   SpO2 96%   BMI 20.37 kg/m   Physical Exam Vitals and nursing note reviewed.  HENT:     Head: Normocephalic and atraumatic.     Mouth/Throat:     Mouth: Mucous membranes are moist.  Eyes:     General:        Right eye: No discharge.        Left eye: No discharge.     Conjunctiva/sclera: Conjunctivae normal.  Cardiovascular:     Rate and Rhythm: Normal rate and regular rhythm.     Pulses: Normal pulses.     Heart sounds: Normal heart sounds.  Pulmonary:     Effort: Pulmonary effort is normal.     Breath sounds: Normal breath sounds.  Abdominal:     General: Abdomen is flat.     Palpations: Abdomen is soft.  Skin:    General: Skin is warm and dry.  Neurological:     General: No focal  deficit present.     Comments: GCS 15. Speech is goal oriented. No deficits appreciated to CN III-XII; symmetric eyebrow raise, no facial drooping, tongue midline. Patient has equal grip strength bilaterally with 5/5 strength against resistance in all major muscle groups bilaterally. Sensation to light touch intact. Patient moves extremities without ataxia. Normal finger-nose-finger. Patient ambulatory with steady gait.  Psychiatric:        Mood and Affect: Mood normal.     (all labs ordered are listed, but only abnormal results are displayed) Labs Reviewed  ETHANOL - Abnormal; Notable for the following components:      Result Value   Alcohol, Ethyl (B) 28 (*)    All other components within normal limits  COMPREHENSIVE METABOLIC PANEL WITH GFR - Abnormal; Notable for the following components:   CO2 20 (*)    Glucose, Bld 110 (*)    BUN 7 (*)    AST 83 (*)    ALT 99 (*)    Anion gap 17 (*)    All other components within normal limits   CBC WITH DIFFERENTIAL/PLATELET - Abnormal; Notable for the following components:   RBC 3.78 (*)    MCV 107.9 (*)    MCH 37.0 (*)    Neutro Abs 7.8 (*)    All other components within normal limits  URINALYSIS, ROUTINE W REFLEX MICROSCOPIC - Abnormal; Notable for the following components:   APPearance HAZY (*)    Ketones, ur 20 (*)    All other components within normal limits  RAPID URINE DRUG SCREEN, HOSP PERFORMED    EKG: EKG Interpretation Date/Time:  Monday August 11 2023 18:26:46 EDT Ventricular Rate:  77 PR Interval:  143 QRS Duration:  94 QT Interval:  423 QTC Calculation: 479 R Axis:   23  Text Interpretation: Sinus rhythm Consider left atrial enlargement Confirmed by Jerrol Agent ([id number]) on 08/11/2023 6:31:39 PM  Radiology: CT Head Wo Contrast Result Date: 08/11/2023 CLINICAL DATA:  Mental status change, unknown cause EXAM: CT HEAD WITHOUT CONTRAST TECHNIQUE: Contiguous axial images were obtained from the base of the skull through the vertex without intravenous contrast. RADIATION DOSE REDUCTION: This exam was performed according to the departmental dose-optimization program which includes automated exposure control, adjustment of the mA and/or kV according to patient size and/or use of iterative reconstruction technique. COMPARISON:  None Available. FINDINGS: Brain: Normal brain. No evidence of hemorrhage, mass, cortical infarct or hydrocephalus. Vascular: Negative. Skull: Intact and unremarkable. Sinuses/Orbits: Clear paranasal sinuses.  Normal orbits. Other: None. IMPRESSION: Normal. Electronically Signed   By: Evalene Coho M.D.   On: 08/11/2023 19:11     Procedures   Medications Ordered in the ED - No data to display                                  Medical Decision Making Amount and/or Complexity of Data Reviewed Labs: ordered. Radiology: ordered.   Initial Impression and Ddx 63 year old well-appearing female presenting for altered mental status.  Exam was  unremarkable with no focal neurodeficits.  DDx includes stroke, TIA, metabolic encephalopathy, sepsis alcohol intoxication, withdrawal, drug intoxication, other.   Patient PMH that increases complexity of ED encounter:  history of C6 cervical fracture status post cervical spinal fusion  Interpretation of Diagnostics - I independent reviewed and interpreted the labs as followed: Ethanol elevated, anion gap  - I independently visualized the following imaging with scope of interpretation limited to  determining acute life threatening conditions related to emergency care: CT head, which revealed no acute findings  - I personally reviewed and interpreted EKG which revealed sinus rhythm  Patient Reassessment and Ultimate Disposition/Management On reassessment, continued without any neurodeficits. Considered TIA as a possibility but unlikely given complete resolution of symptoms after 2 to 3 minutes at home and has been asymptomatic since this encounter. Also her ABCD 2 score classified her as low risk for TIA. Workup does not suggest sepsis. Suspect her altered mental status earlier today is likely secondary to her alcohol use.  However did advise her to follow-up with neurology. Discussed return precautions. Discharged good condition.  Patient management required discussion with the following services or consulting groups:  None  Complexity of Problems Addressed Acute complicated illness or Injury  Additional Data Reviewed and Analyzed Further history obtained from: Further history from spouse/family member, Past medical history and medications listed in the EMR, and Prior ED visit notes  Patient Encounter Risk Assessment Consideration of hospitalization      Final diagnoses:  Alcohol use  Transient alteration of awareness    ED Discharge Orders     None          Lang Norleen POUR, PA-C 08/11/23 2054    Jerrol Agent, MD 08/11/23 2104

## 2023-08-11 NOTE — Discharge Instructions (Addendum)
 Evaluation today was overall reassuring.  Please follow-up with neurology for altered mental status today.  If your symptoms return or worsen or any other concerning symptom please return to the ED for further evaluation.

## 2023-08-11 NOTE — ED Triage Notes (Signed)
 Pt arrived via POV with her son who reports patient last felt normal yesterday. According to her son and Patient, Pt was unable to think clearly, confused to environment, confused to people around her, and is confused to time. Pt pleasant in Triage and answering questions to the best of her ability and is aware she is unable to remember certain things.
